# Patient Record
Sex: Female | Born: 1980 | Race: Black or African American | Hispanic: No | Marital: Married | Smoking: Never smoker
Health system: Southern US, Community
[De-identification: ages and names within clinical notes are randomized; demographics above are authoritative.]

## PROBLEM LIST (undated history)

## (undated) ENCOUNTER — Inpatient Hospital Stay (HOSPITAL_COMMUNITY): Payer: Self-pay

## (undated) DIAGNOSIS — K429 Umbilical hernia without obstruction or gangrene: Secondary | ICD-10-CM

## (undated) DIAGNOSIS — F419 Anxiety disorder, unspecified: Secondary | ICD-10-CM

## (undated) DIAGNOSIS — I10 Essential (primary) hypertension: Secondary | ICD-10-CM

## (undated) DIAGNOSIS — F32A Depression, unspecified: Secondary | ICD-10-CM

## (undated) DIAGNOSIS — O24419 Gestational diabetes mellitus in pregnancy, unspecified control: Secondary | ICD-10-CM

## (undated) DIAGNOSIS — J45909 Unspecified asthma, uncomplicated: Secondary | ICD-10-CM

## (undated) DIAGNOSIS — F329 Major depressive disorder, single episode, unspecified: Secondary | ICD-10-CM

## (undated) DIAGNOSIS — O169 Unspecified maternal hypertension, unspecified trimester: Secondary | ICD-10-CM

## (undated) HISTORY — DX: Unspecified maternal hypertension, unspecified trimester: O16.9

## (undated) HISTORY — PX: DILATION AND CURETTAGE OF UTERUS: SHX78

## (undated) HISTORY — DX: Gestational diabetes mellitus in pregnancy, unspecified control: O24.419

---

## 2011-11-13 ENCOUNTER — Ambulatory Visit
Admission: RE | Admit: 2011-11-13 | Discharge: 2011-11-13 | Disposition: A | Discharge status: Home / Self Care | Payer: No Typology Code available for payment source | Source: Ambulatory Visit | Attending: Occupational Medicine | Admitting: Occupational Medicine

## 2011-11-13 ENCOUNTER — Other Ambulatory Visit: Payer: Self-pay | Admitting: Occupational Medicine

## 2011-11-13 DIAGNOSIS — R52 Pain, unspecified: Secondary | ICD-10-CM

## 2013-11-09 ENCOUNTER — Encounter (HOSPITAL_COMMUNITY): Payer: Self-pay | Admitting: Emergency Medicine

## 2013-11-09 ENCOUNTER — Emergency Department (HOSPITAL_COMMUNITY)
Admission: EM | Admit: 2013-11-09 | Discharge: 2013-11-09 | Discharge status: Against Medical Advice | Payer: Medicaid Other | Attending: Emergency Medicine | Admitting: Emergency Medicine

## 2013-11-09 ENCOUNTER — Emergency Department (HOSPITAL_COMMUNITY): Payer: Medicaid Other

## 2013-11-09 DIAGNOSIS — X500XXA Overexertion from strenuous movement or load, initial encounter: Secondary | ICD-10-CM | POA: Insufficient documentation

## 2013-11-09 DIAGNOSIS — S99929A Unspecified injury of unspecified foot, initial encounter: Secondary | ICD-10-CM | POA: Diagnosis not present

## 2013-11-09 DIAGNOSIS — S99919A Unspecified injury of unspecified ankle, initial encounter: Secondary | ICD-10-CM | POA: Diagnosis not present

## 2013-11-09 DIAGNOSIS — S8990XA Unspecified injury of unspecified lower leg, initial encounter: Secondary | ICD-10-CM | POA: Insufficient documentation

## 2013-11-09 DIAGNOSIS — Y9289 Other specified places as the place of occurrence of the external cause: Secondary | ICD-10-CM | POA: Diagnosis not present

## 2013-11-09 DIAGNOSIS — Y9301 Activity, walking, marching and hiking: Secondary | ICD-10-CM | POA: Insufficient documentation

## 2013-11-09 DIAGNOSIS — J45909 Unspecified asthma, uncomplicated: Secondary | ICD-10-CM | POA: Diagnosis not present

## 2013-11-09 HISTORY — DX: Unspecified asthma, uncomplicated: J45.909

## 2013-11-09 NOTE — ED Notes (Signed)
Pt transported back to room from radiology.  

## 2013-11-09 NOTE — ED Notes (Signed)
Pt c/o left knee pain after hearing pop and having pain today while walking

## 2014-07-11 ENCOUNTER — Emergency Department (HOSPITAL_COMMUNITY): Payer: Medicaid Other

## 2014-07-11 ENCOUNTER — Encounter (HOSPITAL_COMMUNITY): Payer: Self-pay

## 2014-07-11 ENCOUNTER — Emergency Department (HOSPITAL_COMMUNITY)
Admission: EM | Admit: 2014-07-11 | Discharge: 2014-07-11 | Disposition: A | Discharge status: Home / Self Care | Payer: Medicaid Other | Attending: Emergency Medicine | Admitting: Emergency Medicine

## 2014-07-11 DIAGNOSIS — Y998 Other external cause status: Secondary | ICD-10-CM | POA: Insufficient documentation

## 2014-07-11 DIAGNOSIS — Z79899 Other long term (current) drug therapy: Secondary | ICD-10-CM | POA: Diagnosis not present

## 2014-07-11 DIAGNOSIS — S63601A Unspecified sprain of right thumb, initial encounter: Secondary | ICD-10-CM | POA: Diagnosis not present

## 2014-07-11 DIAGNOSIS — Y9289 Other specified places as the place of occurrence of the external cause: Secondary | ICD-10-CM | POA: Insufficient documentation

## 2014-07-11 DIAGNOSIS — J45909 Unspecified asthma, uncomplicated: Secondary | ICD-10-CM | POA: Diagnosis not present

## 2014-07-11 DIAGNOSIS — Y9389 Activity, other specified: Secondary | ICD-10-CM | POA: Diagnosis not present

## 2014-07-11 DIAGNOSIS — X58XXXA Exposure to other specified factors, initial encounter: Secondary | ICD-10-CM | POA: Insufficient documentation

## 2014-07-11 DIAGNOSIS — S60931A Unspecified superficial injury of right thumb, initial encounter: Secondary | ICD-10-CM | POA: Diagnosis present

## 2014-07-11 MED ORDER — IBUPROFEN 800 MG PO TABS: 800.0000 mg | ORAL_TABLET | Freq: Three times a day (TID) | ORAL | Status: DC

## 2014-07-11 MED ORDER — IBUPROFEN 400 MG PO TABS: 800.0000 mg | ORAL_TABLET | Freq: Once | ORAL | Status: DC

## 2014-07-11 NOTE — ED Notes (Signed)
Pt reports 30 minutes ago was trying to open new jar of honey and felt sharp pain to right thumb, right hand swollen.  Pt unable to make fist.

## 2014-07-11 NOTE — Discharge Instructions (Signed)
Take ibuprofen as directed for pain. Rest, ice and elevate your hand.  Finger Sprain A finger sprain is a tear in one of the strong, fibrous tissues that connect the bones (ligaments) in your finger. The severity of the sprain depends on how much of the ligament is torn. The tear can be either partial or complete. CAUSES  Often, sprains are a result of a fall or accident. If you extend your hands to catch an object or to protect yourself, the force of the impact causes the fibers of your ligament to stretch too much. This excess tension causes the fibers of your ligament to tear. SYMPTOMS  You may have some loss of motion in your finger. Other symptoms include:  Bruising.  Tenderness.  Swelling. DIAGNOSIS  In order to diagnose finger sprain, your caregiver will physically examine your finger or thumb to determine how torn the ligament is. Your caregiver may also suggest an X-ray exam of your finger to make sure no bones are broken. TREATMENT  If your ligament is only partially torn, treatment usually involves keeping the finger in a fixed position (immobilization) for a short period. To do this, your caregiver will apply a bandage, cast, or splint to keep your finger from moving until it heals. For a partially torn ligament, the healing process usually takes 2 to 3 weeks. If your ligament is completely torn, you may need surgery to reconnect the ligament to the bone. After surgery a cast or splint will be applied and will need to stay on your finger or thumb for 4 to 6 weeks while your ligament heals. HOME CARE INSTRUCTIONS  Keep your injured finger elevated, when possible, to decrease swelling.  To ease pain and swelling, apply ice to your joint twice a day, for 2 to 3 days:  Put ice in a plastic bag.  Place a towel between your skin and the bag.  Leave the ice on for 15 minutes.  Only take over-the-counter or prescription medicine for pain as directed by your caregiver.  Do not  wear rings on your injured finger.  Do not leave your finger unprotected until pain and stiffness go away (usually 3 to 4 weeks).  Do not allow your cast or splint to get wet. Cover your cast or splint with a plastic bag when you shower or bathe. Do not swim.  Your caregiver may suggest special exercises for you to do during your recovery to prevent or limit permanent stiffness. SEEK IMMEDIATE MEDICAL CARE IF:  Your cast or splint becomes damaged.  Your pain becomes worse rather than better. MAKE SURE YOU:  Understand these instructions.  Will watch your condition.  Will get help right away if you are not doing well or get worse. Document Released: 05/17/2004 Document Revised: 07/02/2011 Document Reviewed: 12/11/2010 [facility] Patient Information 2015 [place], [place]. This information is not intended to replace advice given to you by your health care provider. Make sure you discuss any questions you have with your health care provider.

## 2014-07-11 NOTE — ED Provider Notes (Signed)
CSN: [id number]     Arrival date & time 07/11/14  2147 History  This chart was scribed for non-physician practitioner, Celene Skeen, PA-C,working with Eber Hong, MD, by Karle Plumber, ED Scribe. This patient was seen in room TR06C/TR06C and the patient's care was started at 10:47 PM.  Chief Complaint  Patient presents with  . Hand Injury   The history is provided by the patient and medical records. No language interpreter was used.    HPI Comments:  Connie Webster is a 34 y.o. female who presents to the Emergency Department complaining of severe right thumb pain that began approximately one hour ago. She states she was trying to open a jar of honey when she heard a "popping" noise. She states her right thumb and right hand began swelling immediately. She has not done anything to treat her symptoms. Moving the thumb makes the pain worse. Denies alleviating factors. Denies numbness, tingling or weakness of the right hand or right thumb, bruising or wounds. Pt is right hand dominant. PMHx of asthma.  Past Medical History  Diagnosis Date  . Asthma    History reviewed. No pertinent past surgical history. History reviewed. No pertinent family history. History  Substance Use Topics  . Smoking status: Never Smoker   . Smokeless tobacco: Not on file  . Alcohol Use: Yes     Comment: occ   OB History    No data available     Review of Systems  Musculoskeletal: Positive for arthralgias.  All other systems reviewed and are negative.   Allergies  Review of patient's allergies indicates no known allergies.  Home Medications   Prior to Admission medications   Medication Sig Start Date End Date Taking? Authorizing Provider  albuterol (PROVENTIL HFA;VENTOLIN HFA) 108 (90 BASE) MCG/ACT inhaler Inhale 2 puffs into the lungs every 4 (four) hours as needed for wheezing or shortness of breath.    Historical Provider, MD  ibuprofen (ADVIL,MOTRIN) 800 MG tablet Take 1 tablet (800 mg total) by  mouth 3 (three) times daily. 07/11/14   Kathrynn Speed, PA-C   Triage Vitals: BP 140/88 mmHg  Pulse 65  Temp(Src) 98.4 F (36.9 C) (Oral)  Resp 22  SpO2 99%  LMP 06/07/2014 Physical Exam  Constitutional: She is oriented to person, place, and time. She appears well-developed and well-nourished. No distress.  HENT:  Head: Normocephalic and atraumatic.  Mouth/Throat: Oropharynx is clear and moist.  Eyes: Conjunctivae and EOM are normal.  Neck: Normal range of motion. Neck supple.  Cardiovascular: Normal rate, regular rhythm and normal heart sounds.   +2 radial pulse on right.  Pulmonary/Chest: Effort normal and breath sounds normal. No respiratory distress.  Musculoskeletal:  R hand- TTP in webspace between 1st and 2nd metacarpal with mild swelling. TTP over 1st metacarpal, MCP and proximal phalanx. ROM limited by pain. Normal strength with flexion and extension of MCP and DIP of thumb. No evidence of tendon disruption.  Neurological: She is alert and oriented to person, place, and time. No sensory deficit.  Skin: Skin is warm and dry.  Cap refill < 3 seconds.  Psychiatric: She has a normal mood and affect. Her behavior is normal.  Nursing note and vitals reviewed.   ED Course  Procedures (including critical care time) DIAGNOSTIC STUDIES: Oxygen Saturation is 99% on RA, normal by my interpretation.   COORDINATION OF CARE: 10:52 PM- Will prescribe NSAID and ace wrap hand. Pt verbalizes understanding and agrees to plan.  Medications  ibuprofen (ADVIL,MOTRIN) tablet  800 mg (not administered)    Labs Review Labs Reviewed - No data to display  Imaging Review Dg Hand Complete Right  07/11/2014   CLINICAL DATA:  Injured right hand well opening at our of honey. Thumb pain.  EXAM: RIGHT HAND - COMPLETE 3+ VIEW  COMPARISON:  None.  FINDINGS: There is no evidence of fracture or dislocation. There is no evidence of arthropathy or other focal bone abnormality. Soft tissues are unremarkable.   IMPRESSION: Negative.   Electronically Signed   By: Ellery Plunkaniel R Mitchell M.D.   On: 07/11/2014 22:30     EKG Interpretation None      MDM   Final diagnoses:  Thumb sprain, right, initial encounter   Neurovascularly intact. Xray negative. RICE, NSAIDs. ACE wrap applied. Stable for d/c. Return precautions given. Patient states understanding of treatment care plan and is agreeable.  I personally performed the services described in this documentation, which was scribed in my presence. The recorded information has been reviewed and is accurate.    Kathrynn SpeedRobyn M Lessie Funderburke, PA-C 07/11/14 2259  Eber HongBrian Miller, MD 07/12/14 [phone]

## 2016-04-23 NOTE — L&D Delivery Note (Signed)
Patient is 36 y.o. [id number] 614w3d admitted for IOL 2/2 gHTN. S/p IOL with cytotec, followed by Pitocin. SROM at 0115.  Prenatal course also complicated by gHTN, A1GDM, AMA.  Delivery Note At 3:22 AM a viable female was delivered via Vaginal, Spontaneous (Presentation: LOA).  APGAR: 7, 8; weight  pending.   Placenta status: Intact, .  Cord: 3V  with the following complications: None.  Cord pH: N/A  Anesthesia: Epidural  Episiotomy: None Lacerations: None Est. Blood Loss (mL): 100  Head delivered LOA. No nuchal cord present. Shoulder and body delivered in usual fashion. Infant with spontaneous cry, placed on mother's abdomen, dried and bulb suctioned. Cord clamped x 2 after 1-minute delay, and cut by family member. Cord blood drawn. Placenta delivered spontaneously with gentle cord traction. Fundus firm with massage and Pitocin. Perineum inspected and found to have no laceration.  Mom to postpartum.  Baby to Couplet care / Skin to Skin.  Caryl AdaJazma Rida Loudin, DO OB Fellow

## 2016-09-11 ENCOUNTER — Encounter: Payer: Self-pay | Admitting: Obstetrics and Gynecology

## 2016-09-11 ENCOUNTER — Ambulatory Visit (INDEPENDENT_AMBULATORY_CARE_PROVIDER_SITE_OTHER): Payer: BLUE CROSS/BLUE SHIELD | Admitting: Obstetrics and Gynecology

## 2016-09-11 VITALS — BP 130/83 | HR 80 | Ht 63.25 in | Wt 143.0 lb

## 2016-09-11 DIAGNOSIS — Z3480 Encounter for supervision of other normal pregnancy, unspecified trimester: Secondary | ICD-10-CM

## 2016-09-11 DIAGNOSIS — J45909 Unspecified asthma, uncomplicated: Secondary | ICD-10-CM

## 2016-09-11 DIAGNOSIS — Z113 Encounter for screening for infections with a predominantly sexual mode of transmission: Secondary | ICD-10-CM | POA: Diagnosis not present

## 2016-09-11 DIAGNOSIS — O09521 Supervision of elderly multigravida, first trimester: Secondary | ICD-10-CM

## 2016-09-11 DIAGNOSIS — Z124 Encounter for screening for malignant neoplasm of cervix: Secondary | ICD-10-CM | POA: Diagnosis not present

## 2016-09-11 DIAGNOSIS — O99511 Diseases of the respiratory system complicating pregnancy, first trimester: Secondary | ICD-10-CM

## 2016-09-11 DIAGNOSIS — O0993 Supervision of high risk pregnancy, unspecified, third trimester: Secondary | ICD-10-CM | POA: Insufficient documentation

## 2016-09-11 DIAGNOSIS — Z3481 Encounter for supervision of other normal pregnancy, first trimester: Secondary | ICD-10-CM

## 2016-09-11 DIAGNOSIS — O09529 Supervision of elderly multigravida, unspecified trimester: Secondary | ICD-10-CM | POA: Insufficient documentation

## 2016-09-11 DIAGNOSIS — O99519 Diseases of the respiratory system complicating pregnancy, unspecified trimester: Secondary | ICD-10-CM | POA: Insufficient documentation

## 2016-09-11 MED ORDER — VITAFOL ULTRA 29-0.6-0.4-200 MG PO CAPS: 1.0000 | ORAL_CAPSULE | Freq: Every day | ORAL | 12 refills | Status: DC

## 2016-09-11 NOTE — Addendum Note (Signed)
Addended by: Catalina AntiguaONSTANT, Cohl Behrens on: 09/11/2016 02:21 PM   Modules accepted: Orders

## 2016-09-11 NOTE — Patient Instructions (Signed)
 First Trimester of Pregnancy The first trimester of pregnancy is from week 1 until the end of week 13 (months 1 through 3). A week after a sperm fertilizes an egg, the egg will implant on the wall of the uterus. This embryo will begin to develop into a baby. Genes from you and your partner will form the baby. The female genes will determine whether the baby will be a boy or a girl. At 6-8 weeks, the eyes and face will be formed, and the heartbeat can be seen on ultrasound. At the end of 12 weeks, all the baby's organs will be formed. Now that you are pregnant, you will want to do everything you can to have a healthy baby. Two of the most important things are to get good prenatal care and to follow your health care provider's instructions. Prenatal care is all the medical care you receive before the baby's birth. This care will help prevent, find, and treat any problems during the pregnancy and childbirth. Body changes during your first trimester Your body goes through many changes during pregnancy. The changes vary from woman to woman.  You may gain or lose a couple of pounds at first.  You may feel sick to your stomach (nauseous) and you may throw up (vomit). If the vomiting is uncontrollable, call your health care provider.  You may tire easily.  You may develop headaches that can be relieved by medicines. All medicines should be approved by your health care provider.  You may urinate more often. Painful urination may mean you have a bladder infection.  You may develop heartburn as a result of your pregnancy.  You may develop constipation because certain hormones are causing the muscles that push stool through your intestines to slow down.  You may develop hemorrhoids or swollen veins (varicose veins).  Your breasts may begin to grow larger and become tender. Your nipples may stick out more, and the tissue that surrounds them (areola) may become darker.  Your gums may bleed and may be  sensitive to brushing and flossing.  Dark spots or blotches (chloasma, mask of pregnancy) may develop on your face. This will likely fade after the baby is born.  Your menstrual periods will stop.  You may have a loss of appetite.  You may develop cravings for certain kinds of food.  You may have changes in your emotions from day to day, such as being excited to be pregnant or being concerned that something may go wrong with the pregnancy and baby.  You may have more vivid and strange dreams.  You may have changes in your hair. These can include thickening of your hair, rapid growth, and changes in texture. Some women also have hair loss during or after pregnancy, or hair that feels dry or thin. Your hair will most likely return to normal after your baby is born.  What to expect at prenatal visits During a routine prenatal visit:  You will be weighed to make sure you and the baby are growing normally.  Your blood pressure will be taken.  Your abdomen will be measured to track your baby's growth.  The fetal heartbeat will be listened to between weeks 10 and 14 of your pregnancy.  Test results from any previous visits will be discussed.  Your health care provider may ask you:  How you are feeling.  If you are feeling the baby move.  If you have had any abnormal symptoms, such as leaking fluid, bleeding, severe   headaches, or abdominal cramping.  If you are using any tobacco products, including cigarettes, chewing tobacco, and electronic cigarettes.  If you have any questions.  Other tests that may be performed during your first trimester include:  Blood tests to find your blood type and to check for the presence of any previous infections. The tests will also be used to check for low iron levels (anemia) and protein on red blood cells (Rh antibodies). Depending on your risk factors, or if you previously had diabetes during pregnancy, you may have tests to check for high blood  sugar that affects pregnant women (gestational diabetes).  Urine tests to check for infections, diabetes, or protein in the urine.  An ultrasound to confirm the proper growth and development of the baby.  Fetal screens for spinal cord problems (spina bifida) and Down syndrome.  HIV (human immunodeficiency virus) testing. Routine prenatal testing includes screening for HIV, unless you choose not to have this test.  You may need other tests to make sure you and the baby are doing well.  Follow these instructions at home: Medicines  Follow your health care provider's instructions regarding medicine use. Specific medicines may be either safe or unsafe to take during pregnancy.  Take a prenatal vitamin that contains at least 600 micrograms (mcg) of folic acid.  If you develop constipation, try taking a stool softener if your health care provider approves. Eating and drinking  Eat a balanced diet that includes fresh fruits and vegetables, whole grains, good sources of protein such as meat, eggs, or tofu, and low-fat dairy. Your health care provider will help you determine the amount of weight gain that is right for you.  Avoid raw meat and uncooked cheese. These carry germs that can cause birth defects in the baby.  Eating four or five small meals rather than three large meals a day may help relieve nausea and vomiting. If you start to feel nauseous, eating a few soda crackers can be helpful. Drinking liquids between meals, instead of during meals, also seems to help ease nausea and vomiting.  Limit foods that are high in fat and processed sugars, such as fried and sweet foods.  To prevent constipation: ? Eat foods that are high in fiber, such as fresh fruits and vegetables, whole grains, and beans. ? Drink enough fluid to keep your urine clear or pale yellow. Activity  Exercise only as directed by your health care provider. Most women can continue their usual exercise routine during  pregnancy. Try to exercise for 30 minutes at least 5 days a week. Exercising will help you: ? Control your weight. ? Stay in shape. ? Be prepared for labor and delivery.  Experiencing pain or cramping in the lower abdomen or lower back is a good sign that you should stop exercising. Check with your health care provider before continuing with normal exercises.  Try to avoid standing for long periods of time. Move your legs often if you must stand in one place for a long time.  Avoid heavy lifting.  Wear low-heeled shoes and practice good posture.  You may continue to have sex unless your health care provider tells you not to. Relieving pain and discomfort  Wear a good support bra to relieve breast tenderness.  Take warm sitz baths to soothe any pain or discomfort caused by hemorrhoids. Use hemorrhoid cream if your health care provider approves.  Rest with your legs elevated if you have leg cramps or low back pain.  If you   develop varicose veins in your legs, wear support hose. Elevate your feet for 15 minutes, 3-4 times a day. Limit salt in your diet. Prenatal care  Schedule your prenatal visits by the twelfth week of pregnancy. They are usually scheduled monthly at first, then more often in the last 2 months before delivery.  Write down your questions. Take them to your prenatal visits.  Keep all your prenatal visits as told by your health care provider. This is important. Safety  Wear your seat belt at all times when driving.  Make a list of emergency phone numbers, including numbers for family, friends, the hospital, and police and fire departments. General instructions  Ask your health care provider for a referral to a local prenatal education class. Begin classes no later than the beginning of month 6 of your pregnancy.  Ask for help if you have counseling or nutritional needs during pregnancy. Your health care provider can offer advice or refer you to specialists for help  with various needs.  Do not use hot tubs, steam rooms, or saunas.  Do not douche or use tampons or scented sanitary pads.  Do not cross your legs for long periods of time.  Avoid cat litter boxes and soil used by cats. These carry germs that can cause birth defects in the baby and possibly loss of the fetus by miscarriage or stillbirth.  Avoid all smoking, herbs, alcohol, and medicines not prescribed by your health care provider. Chemicals in these products affect the formation and growth of the baby.  Do not use any products that contain nicotine or tobacco, such as cigarettes and e-cigarettes. If you need help quitting, ask your health care provider. You may receive counseling support and other resources to help you quit.  Schedule a dentist appointment. At home, brush your teeth with a soft toothbrush and be gentle when you floss. Contact a health care provider if:  You have dizziness.  You have mild pelvic cramps, pelvic pressure, or nagging pain in the abdominal area.  You have persistent nausea, vomiting, or diarrhea.  You have a bad smelling vaginal discharge.  You have pain when you urinate.  You notice increased swelling in your face, hands, legs, or ankles.  You are exposed to fifth disease or chickenpox.  You are exposed to German measles (rubella) and have never had it. Get help right away if:  You have a fever.  You are leaking fluid from your vagina.  You have spotting or bleeding from your vagina.  You have severe abdominal cramping or pain.  You have rapid weight gain or loss.  You vomit blood or material that looks like coffee grounds.  You develop a severe headache.  You have shortness of breath.  You have any kind of trauma, such as from a fall or a car accident. Summary  The first trimester of pregnancy is from week 1 until the end of week 13 (months 1 through 3).  Your body goes through many changes during pregnancy. The changes vary from  woman to woman.  You will have routine prenatal visits. During those visits, your health care provider will examine you, discuss any test results you may have, and talk with you about how you are feeling. This information is not intended to replace advice given to you by your health care provider. Make sure you discuss any questions you have with your health care provider. Document Released: 04/03/2001 Document Revised: 03/21/2016 Document Reviewed: 03/21/2016 Elsevier Interactive Patient Education  2017   [employer]  Contraception Choices Contraception (birth control) is the use of any methods or devices to prevent pregnancy. Below are some methods to help avoid pregnancy. Hormonal methods  Contraceptive implant. This is a thin, plastic tube containing progesterone hormone. It does not contain estrogen hormone. Your health care provider inserts the tube in the inner part of the upper arm. The tube can remain in place for up to 3 years. After 3 years, the implant must be removed. The implant prevents the ovaries from releasing an egg (ovulation), thickens the cervical mucus to prevent sperm from entering the uterus, and thins the lining of the inside of the uterus.  Progesterone-only injections. These injections are given every 3 months by your health care provider to prevent pregnancy. This synthetic progesterone hormone stops the ovaries from releasing eggs. It also thickens cervical mucus and changes the uterine lining. This makes it harder for sperm to survive in the uterus.  Birth control pills. These pills contain estrogen and progesterone hormone. They work by preventing the ovaries from releasing eggs (ovulation). They also cause the cervical mucus to thicken, preventing the sperm from entering the uterus. Birth control pills are prescribed by a health care provider.Birth control pills can also be used to treat heavy periods.  Minipill. This type of birth control pill contains only the  progesterone hormone. They are taken every day of each month and must be prescribed by your health care provider.  Birth control patch. The patch contains hormones similar to those in birth control pills. It must be changed once a week and is prescribed by a health care provider.  Vaginal ring. The ring contains hormones similar to those in birth control pills. It is left in the vagina for 3 weeks, removed for 1 week, and then a new one is put back in place. The patient must be comfortable inserting and removing the ring from the vagina.A health care provider's prescription is necessary.  Emergency contraception. Emergency contraceptives prevent pregnancy after unprotected sexual intercourse. This pill can be taken right after sex or up to 5 days after unprotected sex. It is most effective the sooner you take the pills after having sexual intercourse. Most emergency contraceptive pills are available without a prescription. Check with your pharmacist. Do not use emergency contraception as your only form of birth control. Barrier methods  Female condom. This is a thin sheath (latex or rubber) that is worn over the penis during sexual intercourse. It can be used with spermicide to increase effectiveness.  Female condom. This is a soft, loose-fitting sheath that is put into the vagina before sexual intercourse.  Diaphragm. This is a soft, latex, dome-shaped barrier that must be fitted by a health care provider. It is inserted into the vagina, along with a spermicidal jelly. It is inserted before intercourse. The diaphragm should be left in the vagina for 6 to 8 hours after intercourse.  Cervical cap. This is a round, soft, latex or plastic cup that fits over the cervix and must be fitted by a health care provider. The cap can be left in place for up to 48 hours after intercourse.  Sponge. This is a soft, circular piece of polyurethane foam. The sponge has spermicide in it. It is inserted into the vagina  after wetting it and before sexual intercourse.  Spermicides. These are chemicals that kill or block sperm from entering the cervix and uterus. They come in the form of creams, jellies, suppositories, foam, or tablets. They do not   require a prescription. They are inserted into the vagina with an applicator before having sexual intercourse. The process must be repeated every time you have sexual intercourse. Intrauterine contraception  Intrauterine device (IUD). This is a T-shaped device that is put in a woman's uterus during a menstrual period to prevent pregnancy. There are 2 types: ? Copper IUD. This type of IUD is wrapped in copper wire and is placed inside the uterus. Copper makes the uterus and fallopian tubes produce a fluid that kills sperm. It can stay in place for 10 years. ? Hormone IUD. This type of IUD contains the hormone progestin (synthetic progesterone). The hormone thickens the cervical mucus and prevents sperm from entering the uterus, and it also thins the uterine lining to prevent implantation of a fertilized egg. The hormone can weaken or kill the sperm that get into the uterus. It can stay in place for 3-5 years, depending on which type of IUD is used. Permanent methods of contraception  Female tubal ligation. This is when the woman's fallopian tubes are surgically sealed, tied, or blocked to prevent the egg from traveling to the uterus.  Hysteroscopic sterilization. This involves placing a small coil or insert into each fallopian tube. Your doctor uses a technique called hysteroscopy to do the procedure. The device causes scar tissue to form. This results in permanent blockage of the fallopian tubes, so the sperm cannot fertilize the egg. It takes about 3 months after the procedure for the tubes to become blocked. You must use another form of birth control for these 3 months.  Female sterilization. This is when the female has the tubes that carry sperm tied off (vasectomy).This  blocks sperm from entering the vagina during sexual intercourse. After the procedure, the man can still ejaculate fluid (semen). Natural planning methods  Natural family planning. This is not having sexual intercourse or using a barrier method (condom, diaphragm, cervical cap) on days the woman could become pregnant.  Calendar method. This is keeping track of the length of each menstrual cycle and identifying when you are fertile.  Ovulation method. This is avoiding sexual intercourse during ovulation.  Symptothermal method. This is avoiding sexual intercourse during ovulation, using a thermometer and ovulation symptoms.  Post-ovulation method. This is timing sexual intercourse after you have ovulated. Regardless of which type or method of contraception you choose, it is important that you use condoms to protect against the transmission of sexually transmitted infections (STIs). Talk with your health care provider about which form of contraception is most appropriate for you. This information is not intended to replace advice given to you by your health care provider. Make sure you discuss any questions you have with your health care provider. Document Released: 04/09/2005 Document Revised: 09/15/2015 Document Reviewed: 10/02/2012 Elsevier Interactive Patient Education  2017 [employer]   Breastfeeding Deciding to breastfeed is one of the best choices you can make for you and your baby. A change in hormones during pregnancy causes your breast tissue to grow and increases the number and size of your milk ducts. These hormones also allow proteins, sugars, and fats from your blood supply to make breast milk in your milk-producing glands. Hormones prevent breast milk from being released before your baby is born as well as prompt milk flow after birth. Once breastfeeding has begun, thoughts of your baby, as well as his or her sucking or crying, can stimulate the release of milk from your  milk-producing glands. Benefits of breastfeeding For Your Baby  Your   first milk (colostrum) helps your baby's digestive system function better.  There are antibodies in your milk that help your baby fight off infections.  Your baby has a lower incidence of asthma, allergies, and sudden infant death syndrome.  The nutrients in breast milk are better for your baby than infant formulas and are designed uniquely for your baby's needs.  Breast milk improves your baby's brain development.  Your baby is less likely to develop other conditions, such as childhood obesity, asthma, or type 2 diabetes mellitus.  For You  Breastfeeding helps to create a very special bond between you and your baby.  Breastfeeding is convenient. Breast milk is always available at the correct temperature and costs nothing.  Breastfeeding helps to burn calories and helps you lose the weight gained during pregnancy.  Breastfeeding makes your uterus contract to its prepregnancy size faster and slows bleeding (lochia) after you give birth.  Breastfeeding helps to lower your risk of developing type 2 diabetes mellitus, osteoporosis, and breast or ovarian cancer later in life.  Signs that your baby is hungry Early Signs of Hunger  Increased alertness or activity.  Stretching.  Movement of the head from side to side.  Movement of the head and opening of the mouth when the corner of the mouth or cheek is stroked (rooting).  Increased sucking sounds, smacking lips, cooing, sighing, or squeaking.  Hand-to-mouth movements.  Increased sucking of fingers or hands.  Late Signs of Hunger  Fussing.  Intermittent crying.  Extreme Signs of Hunger Signs of extreme hunger will require calming and consoling before your baby will be able to breastfeed successfully. Do not wait for the following signs of extreme hunger to occur before you initiate breastfeeding:  Restlessness.  A loud, strong  cry.  Screaming.  Breastfeeding basics Breastfeeding Initiation  Find a comfortable place to sit or lie down, with your neck and back well supported.  Place a pillow or rolled up blanket under your baby to bring him or her to the level of your breast (if you are seated). Nursing pillows are specially designed to help support your arms and your baby while you breastfeed.  Make sure that your baby's abdomen is facing your abdomen.  Gently massage your breast. With your fingertips, massage from your chest wall toward your nipple in a circular motion. This encourages milk flow. You may need to continue this action during the feeding if your milk flows slowly.  Support your breast with 4 fingers underneath and your thumb above your nipple. Make sure your fingers are well away from your nipple and your baby's mouth.  Stroke your baby's lips gently with your finger or nipple.  When your baby's mouth is open wide enough, quickly bring your baby to your breast, placing your entire nipple and as much of the colored area around your nipple (areola) as possible into your baby's mouth. ? More areola should be visible above your baby's upper lip than below the lower lip. ? Your baby's tongue should be between his or her lower gum and your breast.  Ensure that your baby's mouth is correctly positioned around your nipple (latched). Your baby's lips should create a seal on your breast and be turned out (everted).  It is common for your baby to suck about 2-3 minutes in order to start the flow of breast milk.  Latching Teaching your baby how to latch on to your breast properly is very important. An improper latch can cause nipple pain and decreased   milk supply for you and poor weight gain in your baby. Also, if your baby is not latched onto your nipple properly, he or she may swallow some air during feeding. This can make your baby fussy. Burping your baby when you switch breasts during the feeding can  help to get rid of the air. However, teaching your baby to latch on properly is still the best way to prevent fussiness from swallowing air while breastfeeding. Signs that your baby has successfully latched on to your nipple:  Silent tugging or silent sucking, without causing you pain.  Swallowing heard between every 3-4 sucks.  Muscle movement above and in front of his or her ears while sucking.  Signs that your baby has not successfully latched on to nipple:  Sucking sounds or smacking sounds from your baby while breastfeeding.  Nipple pain.  If you think your baby has not latched on correctly, slip your finger into the corner of your baby's mouth to break the suction and place it between your baby's gums. Attempt breastfeeding initiation again. Signs of Successful Breastfeeding Signs from your baby:  A gradual decrease in the number of sucks or complete cessation of sucking.  Falling asleep.  Relaxation of his or her body.  Retention of a small amount of milk in his or her mouth.  Letting go of your breast by himself or herself.  Signs from you:  Breasts that have increased in firmness, weight, and size 1-3 hours after feeding.  Breasts that are softer immediately after breastfeeding.  Increased milk volume, as well as a change in milk consistency and color by the fifth day of breastfeeding.  Nipples that are not sore, cracked, or bleeding.  Signs That Your Baby is Getting Enough Milk  Wetting at least 1-2 diapers during the first 24 hours after birth.  Wetting at least 5-6 diapers every 24 hours for the first week after birth. The urine should be clear or pale yellow by 5 days after birth.  Wetting 6-8 diapers every 24 hours as your baby continues to grow and develop.  At least 3 stools in a 24-hour period by age 5 days. The stool should be soft and yellow.  At least 3 stools in a 24-hour period by age 7 days. The stool should be seedy and yellow.  No loss of  weight greater than 10% of birth weight during the first 3 days of age.  Average weight gain of 4-7 ounces (113-198 g) per week after age 4 days.  Consistent daily weight gain by age 5 days, without weight loss after the age of 2 weeks.  After a feeding, your baby may spit up a small amount. This is common. Breastfeeding frequency and duration Frequent feeding will help you make more milk and can prevent sore nipples and breast engorgement. Breastfeed when you feel the need to reduce the fullness of your breasts or when your baby shows signs of hunger. This is called "breastfeeding on demand." Avoid introducing a pacifier to your baby while you are working to establish breastfeeding (the first 4-6 weeks after your baby is born). After this time you may choose to use a pacifier. Research has shown that pacifier use during the first year of a baby's life decreases the risk of sudden infant death syndrome (SIDS). Allow your baby to feed on each breast as long as he or she wants. Breastfeed until your baby is finished feeding. When your baby unlatches or falls asleep while feeding from the first   breast, offer the second breast. Because newborns are often sleepy in the first few weeks of life, you may need to awaken your baby to get him or her to feed. Breastfeeding times will vary from baby to baby. However, the following rules can serve as a guide to help you ensure that your baby is properly fed:  Newborns (babies 4 weeks of age or younger) may breastfeed every 1-3 hours.  Newborns should not go longer than 3 hours during the day or 5 hours during the night without breastfeeding.  You should breastfeed your baby a minimum of 8 times in a 24-hour period until you begin to introduce solid foods to your baby at around 6 months of age.  Breast milk pumping Pumping and storing breast milk allows you to ensure that your baby is exclusively fed your breast milk, even at times when you are unable to  breastfeed. This is especially important if you are going back to work while you are still breastfeeding or when you are not able to be present during feedings. Your lactation consultant can give you guidelines on how long it is safe to store breast milk. A breast pump is a machine that allows you to pump milk from your breast into a sterile bottle. The pumped breast milk can then be stored in a refrigerator or freezer. Some breast pumps are operated by hand, while others use electricity. Ask your lactation consultant which type will work best for you. Breast pumps can be purchased, but some hospitals and breastfeeding support groups lease breast pumps on a monthly basis. A lactation consultant can teach you how to hand express breast milk, if you prefer not to use a pump. Caring for your breasts while you breastfeed Nipples can become dry, cracked, and sore while breastfeeding. The following recommendations can help keep your breasts moisturized and healthy:  Avoid using soap on your nipples.  Wear a supportive bra. Although not required, special nursing bras and tank tops are designed to allow access to your breasts for breastfeeding without taking off your entire bra or top. Avoid wearing underwire-style bras or extremely tight bras.  Air dry your nipples for 3-4minutes after each feeding.  Use only cotton bra pads to absorb leaked breast milk. Leaking of breast milk between feedings is normal.  Use lanolin on your nipples after breastfeeding. Lanolin helps to maintain your skin's normal moisture barrier. If you use pure lanolin, you do not need to wash it off before feeding your baby again. Pure lanolin is not toxic to your baby. You may also hand express a few drops of breast milk and gently massage that milk into your nipples and allow the milk to air dry.  In the first few weeks after giving birth, some women experience extremely full breasts (engorgement). Engorgement can make your breasts  feel heavy, warm, and tender to the touch. Engorgement peaks within 3-5 days after you give birth. The following recommendations can help ease engorgement:  Completely empty your breasts while breastfeeding or pumping. You may want to start by applying warm, moist heat (in the shower or with warm water-soaked hand towels) just before feeding or pumping. This increases circulation and helps the milk flow. If your baby does not completely empty your breasts while breastfeeding, pump any extra milk after he or she is finished.  Wear a snug bra (nursing or regular) or tank top for 1-2 days to signal your body to slightly decrease milk production.  Apply ice packs to   your breasts, unless this is too uncomfortable for you.  Make sure that your baby is latched on and positioned properly while breastfeeding.  If engorgement persists after 48 hours of following these recommendations, contact your health care provider or a lactation consultant. Overall health care recommendations while breastfeeding  Eat healthy foods. Alternate between meals and snacks, eating 3 of each per day. Because what you eat affects your breast milk, some of the foods may make your baby more irritable than usual. Avoid eating these foods if you are sure that they are negatively affecting your baby.  Drink milk, fruit juice, and water to satisfy your thirst (about 10 glasses a day).  Rest often, relax, and continue to take your prenatal vitamins to prevent fatigue, stress, and anemia.  Continue breast self-awareness checks.  Avoid chewing and smoking tobacco. Chemicals from cigarettes that pass into breast milk and exposure to secondhand smoke may harm your baby.  Avoid alcohol and drug use, including marijuana. Some medicines that may be harmful to your baby can pass through breast milk. It is important to ask your health care provider before taking any medicine, including all over-the-counter and prescription medicine as well  as vitamin and herbal supplements. It is possible to become pregnant while breastfeeding. If birth control is desired, ask your health care provider about options that will be safe for your baby. Contact a health care provider if:  You feel like you want to stop breastfeeding or have become frustrated with breastfeeding.  You have painful breasts or nipples.  Your nipples are cracked or bleeding.  Your breasts are red, tender, or warm.  You have a swollen area on either breast.  You have a fever or chills.  You have nausea or vomiting.  You have drainage other than breast milk from your nipples.  Your breasts do not become full before feedings by the fifth day after you give birth.  You feel sad and depressed.  Your baby is too sleepy to eat well.  Your baby is having trouble sleeping.  Your baby is wetting less than 3 diapers in a 24-hour period.  Your baby has less than 3 stools in a 24-hour period.  Your baby's skin or the white part of his or her eyes becomes yellow.  Your baby is not gaining weight by 5 days of age. Get help right away if:  Your baby is overly tired (lethargic) and does not want to wake up and feed.  Your baby develops an unexplained fever. This information is not intended to replace advice given to you by your health care provider. Make sure you discuss any questions you have with your health care provider. Document Released: 04/09/2005 Document Revised: 09/21/2015 Document Reviewed: 10/01/2012 Elsevier Interactive Patient Education  2017 [employer]  

## 2016-09-11 NOTE — Addendum Note (Signed)
Addended by: Gita KudoLASSITER, Bolden Hagerman S on: 09/11/2016 02:40 PM   Modules accepted: Orders

## 2016-09-11 NOTE — Progress Notes (Signed)
Bedside TV US performed, SIUP noted with + FHR 168 and CRL measuring 7538w1d.

## 2016-09-11 NOTE — Progress Notes (Signed)
  Subjective:    Connie Webster is a G3P1011 4558w1d (by office ultrasound) being seen today for her first obstetrical visit.  Her obstetrical history is significant for advanced maternal age and asthma well controlled with albuterol. Patient does intend to breast feed. Pregnancy history fully reviewed.  Patient reports nausea.  Vitals:   09/11/16 1340 09/11/16 1356  BP: 130/83   Pulse: 80   Weight: 143 lb (64.9 kg)   Height:  5' 3.25" (1.607 m)    HISTORY: OB History  Gravida Para Term Preterm AB Living  3 1 1   1 1   SAB TAB Ectopic Multiple Live Births  1       1    # Outcome Date GA Lbr Len/2nd Weight Sex Delivery Anes PTL Lv  3 Current           2 Term 06/12/07 6641w0d  7 lb 2 oz (3.232 kg) F Vag-Spont   LIV  1 SAB 2007 3932w0d            Past Medical History:  Diagnosis Date  . Asthma    Past Surgical History:  Procedure Laterality Date  . DILATION AND CURETTAGE OF UTERUS     Family History  Problem Relation Age of Onset  . Epilepsy Father   . Epilepsy Brother      Exam    Uterus:     Pelvic Exam:    Perineum: No Hemorrhoids, Normal Perineum   Vulva: normal   Vagina:  normal mucosa, normal discharge   pH:    Cervix: multiparous appearance and cervic is closed and long   Adnexa: normal adnexa and no mass, fullness, tenderness   Bony Pelvis: gynecoid  System: Breast:  normal appearance, no masses or tenderness   Skin: normal coloration and turgor, no rashes    Neurologic: oriented, no focal deficits   Extremities: normal strength, tone, and muscle mass   HEENT extra ocular movement intact   Mouth/Teeth mucous membranes moist, pharynx normal without lesions and dental hygiene good   Neck supple and no masses   Cardiovascular: regular rate and rhythm   Respiratory:  chest clear, no wheezing, crepitations, rhonchi, normal symmetric air entry   Abdomen: soft, non-tender; bowel sounds normal; no masses,  no organomegaly   Urinary:       Assessment:    Pregnancy: G3P1011 Patient Active Problem List   Diagnosis Date Noted  . Supervision of other normal pregnancy, antepartum 09/11/2016  . AMA (advanced maternal age) multigravida 35+ 09/11/2016        Plan:     Initial labs drawn. Prenatal vitamins. Problem list reviewed and updated. Genetic Screening discussed First Screen: undecided. Patient referred to genetic counseling  Ultrasound discussed; fetal survey: requested. Will be ordered at a subsequent visit  Follow up in 4 weeks. 50% of 30 min visit spent on counseling and coordination of care.     Jaquarious Grey 09/11/2016

## 2016-09-11 NOTE — Progress Notes (Signed)
No pap on file.

## 2016-09-13 LAB — CYTOLOGY - PAP
Chlamydia: NEGATIVE
Diagnosis: NEGATIVE
HPV: NOT DETECTED
Neisseria Gonorrhea: NEGATIVE

## 2016-09-13 LAB — URINE CULTURE, OB REFLEX

## 2016-09-13 LAB — CULTURE, OB URINE

## 2016-09-18 ENCOUNTER — Other Ambulatory Visit: Payer: Self-pay | Admitting: Obstetrics and Gynecology

## 2016-09-18 MED ORDER — CEPHALEXIN 500 MG PO CAPS: 500.0000 mg | ORAL_CAPSULE | Freq: Four times a day (QID) | ORAL | 0 refills | Status: DC

## 2016-09-24 LAB — OBSTETRIC PANEL, INCLUDING HIV
Antibody Screen: NEGATIVE
Basophils Absolute: 0 10*3/uL (ref 0.0–0.2)
Basos: 0 %
EOS (ABSOLUTE): 0.2 10*3/uL (ref 0.0–0.4)
Eos: 2 %
HIV Screen 4th Generation wRfx: NONREACTIVE
Hematocrit: 41.1 % (ref 34.0–46.6)
Hemoglobin: 13.5 g/dL (ref 11.1–15.9)
Hepatitis B Surface Ag: NEGATIVE
Immature Grans (Abs): 0 10*3/uL (ref 0.0–0.1)
Immature Granulocytes: 0 %
Lymphocytes Absolute: 3.7 10*3/uL — ABNORMAL HIGH (ref 0.7–3.1)
Lymphs: 35 %
MCH: 30.3 pg (ref 26.6–33.0)
MCHC: 32.8 g/dL (ref 31.5–35.7)
MCV: 92 fL (ref 79–97)
Monocytes Absolute: 1 10*3/uL — ABNORMAL HIGH (ref 0.1–0.9)
Monocytes: 9 %
Neutrophils Absolute: 5.6 10*3/uL (ref 1.4–7.0)
Neutrophils: 54 %
Platelets: 335 10*3/uL (ref 150–379)
RBC: 4.46 x10E6/uL (ref 3.77–5.28)
RDW: 14.2 % (ref 12.3–15.4)
RPR Ser Ql: NONREACTIVE
Rh Factor: POSITIVE
Rubella Antibodies, IGG: 1.87 index (ref >0.99)
WBC: 10.6 10*3/uL (ref 3.4–10.8)

## 2016-09-24 LAB — INHERITEST SOCIETY GUIDED

## 2016-10-01 ENCOUNTER — Telehealth: Payer: Self-pay | Admitting: *Deleted

## 2016-10-01 NOTE — Telephone Encounter (Signed)
Rcvd email from [facility] - Called pt and she was at [facility] getting [facility] - she states BP in [facility] was 122/68 and thinks she may be putting BP cuff on incorrectly. Advised pt that we can show her how to apply cuff if needed. Pt expressed understanding.  *Caution - External Email* (1) IN: 2016-10-01 11:41AM SGd  Email response: hi operator,  it looks like a [facility] patient at the clinic below has critically high blood pressure.  the information is as follows:  blood pressure (original): 123/103  [facility] provider name: null null patient name: Connie Webster dob: [birth year] patient phone: [phone] edd: 2017-04-29 gestational age: [age] mrn: [phone] language: english pregnant: yes thanks!  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

## 2016-10-16 ENCOUNTER — Encounter: Payer: Self-pay | Admitting: *Deleted

## 2016-10-16 ENCOUNTER — Ambulatory Visit (INDEPENDENT_AMBULATORY_CARE_PROVIDER_SITE_OTHER): Payer: BLUE CROSS/BLUE SHIELD | Admitting: Family Medicine

## 2016-10-16 VITALS — BP 136/84 | HR 77 | Wt 139.0 lb

## 2016-10-16 DIAGNOSIS — Z3480 Encounter for supervision of other normal pregnancy, unspecified trimester: Secondary | ICD-10-CM

## 2016-10-16 DIAGNOSIS — Z3481 Encounter for supervision of other normal pregnancy, first trimester: Secondary | ICD-10-CM

## 2016-10-16 NOTE — Patient Instructions (Signed)
 First Trimester of Pregnancy The first trimester of pregnancy is from week 1 until the end of week 13 (months 1 through 3). A week after a sperm fertilizes an egg, the egg will implant on the wall of the uterus. This embryo will begin to develop into a baby. Genes from you and your partner will form the baby. The female genes will determine whether the baby will be a boy or a girl. At 6-8 weeks, the eyes and face will be formed, and the heartbeat can be seen on ultrasound. At the end of 12 weeks, all the baby's organs will be formed. Now that you are pregnant, you will want to do everything you can to have a healthy baby. Two of the most important things are to get good prenatal care and to follow your health care provider's instructions. Prenatal care is all the medical care you receive before the baby's birth. This care will help prevent, find, and treat any problems during the pregnancy and childbirth. Body changes during your first trimester Your body goes through many changes during pregnancy. The changes vary from woman to woman.  You may gain or lose a couple of pounds at first.  You may feel sick to your stomach (nauseous) and you may throw up (vomit). If the vomiting is uncontrollable, call your health care provider.  You may tire easily.  You may develop headaches that can be relieved by medicines. All medicines should be approved by your health care provider.  You may urinate more often. Painful urination may mean you have a bladder infection.  You may develop heartburn as a result of your pregnancy.  You may develop constipation because certain hormones are causing the muscles that push stool through your intestines to slow down.  You may develop hemorrhoids or swollen veins (varicose veins).  Your breasts may begin to grow larger and become tender. Your nipples may stick out more, and the tissue that surrounds them (areola) may become darker.  Your gums may bleed and may be  sensitive to brushing and flossing.  Dark spots or blotches (chloasma, mask of pregnancy) may develop on your face. This will likely fade after the baby is born.  Your menstrual periods will stop.  You may have a loss of appetite.  You may develop cravings for certain kinds of food.  You may have changes in your emotions from day to day, such as being excited to be pregnant or being concerned that something may go wrong with the pregnancy and baby.  You may have more vivid and strange dreams.  You may have changes in your hair. These can include thickening of your hair, rapid growth, and changes in texture. Some women also have hair loss during or after pregnancy, or hair that feels dry or thin. Your hair will most likely return to normal after your baby is born.  What to expect at prenatal visits During a routine prenatal visit:  You will be weighed to make sure you and the baby are growing normally.  Your blood pressure will be taken.  Your abdomen will be measured to track your baby's growth.  The fetal heartbeat will be listened to between weeks 10 and 14 of your pregnancy.  Test results from any previous visits will be discussed.  Your health care provider may ask you:  How you are feeling.  If you are feeling the baby move.  If you have had any abnormal symptoms, such as leaking fluid, bleeding, severe   headaches, or abdominal cramping.  If you are using any tobacco products, including cigarettes, chewing tobacco, and electronic cigarettes.  If you have any questions.  Other tests that may be performed during your first trimester include:  Blood tests to find your blood type and to check for the presence of any previous infections. The tests will also be used to check for low iron levels (anemia) and protein on red blood cells (Rh antibodies). Depending on your risk factors, or if you previously had diabetes during pregnancy, you may have tests to check for high blood  sugar that affects pregnant women (gestational diabetes).  Urine tests to check for infections, diabetes, or protein in the urine.  An ultrasound to confirm the proper growth and development of the baby.  Fetal screens for spinal cord problems (spina bifida) and Down syndrome.  HIV (human immunodeficiency virus) testing. Routine prenatal testing includes screening for HIV, unless you choose not to have this test.  You may need other tests to make sure you and the baby are doing well.  Follow these instructions at home: Medicines  Follow your health care provider's instructions regarding medicine use. Specific medicines may be either safe or unsafe to take during pregnancy.  Take a prenatal vitamin that contains at least 600 micrograms (mcg) of folic acid.  If you develop constipation, try taking a stool softener if your health care provider approves. Eating and drinking  Eat a balanced diet that includes fresh fruits and vegetables, whole grains, good sources of protein such as meat, eggs, or tofu, and low-fat dairy. Your health care provider will help you determine the amount of weight gain that is right for you.  Avoid raw meat and uncooked cheese. These carry germs that can cause birth defects in the baby.  Eating four or five small meals rather than three large meals a day may help relieve nausea and vomiting. If you start to feel nauseous, eating a few soda crackers can be helpful. Drinking liquids between meals, instead of during meals, also seems to help ease nausea and vomiting.  Limit foods that are high in fat and processed sugars, such as fried and sweet foods.  To prevent constipation: ? Eat foods that are high in fiber, such as fresh fruits and vegetables, whole grains, and beans. ? Drink enough fluid to keep your urine clear or pale yellow. Activity  Exercise only as directed by your health care provider. Most women can continue their usual exercise routine during  pregnancy. Try to exercise for 30 minutes at least 5 days a week. Exercising will help you: ? Control your weight. ? Stay in shape. ? Be prepared for labor and delivery.  Experiencing pain or cramping in the lower abdomen or lower back is a good sign that you should stop exercising. Check with your health care provider before continuing with normal exercises.  Try to avoid standing for long periods of time. Move your legs often if you must stand in one place for a long time.  Avoid heavy lifting.  Wear low-heeled shoes and practice good posture.  You may continue to have sex unless your health care provider tells you not to. Relieving pain and discomfort  Wear a good support bra to relieve breast tenderness.  Take warm sitz baths to soothe any pain or discomfort caused by hemorrhoids. Use hemorrhoid cream if your health care provider approves.  Rest with your legs elevated if you have leg cramps or low back pain.  If you   develop varicose veins in your legs, wear support hose. Elevate your feet for 15 minutes, 3-4 times a day. Limit salt in your diet. Prenatal care  Schedule your prenatal visits by the twelfth week of pregnancy. They are usually scheduled monthly at first, then more often in the last 2 months before delivery.  Write down your questions. Take them to your prenatal visits.  Keep all your prenatal visits as told by your health care provider. This is important. Safety  Wear your seat belt at all times when driving.  Make a list of emergency phone numbers, including numbers for family, friends, the hospital, and police and fire departments. General instructions  Ask your health care provider for a referral to a local prenatal education class. Begin classes no later than the beginning of month 6 of your pregnancy.  Ask for help if you have counseling or nutritional needs during pregnancy. Your health care provider can offer advice or refer you to specialists for help  with various needs.  Do not use hot tubs, steam rooms, or saunas.  Do not douche or use tampons or scented sanitary pads.  Do not cross your legs for long periods of time.  Avoid cat litter boxes and soil used by cats. These carry germs that can cause birth defects in the baby and possibly loss of the fetus by miscarriage or stillbirth.  Avoid all smoking, herbs, alcohol, and medicines not prescribed by your health care provider. Chemicals in these products affect the formation and growth of the baby.  Do not use any products that contain nicotine or tobacco, such as cigarettes and e-cigarettes. If you need help quitting, ask your health care provider. You may receive counseling support and other resources to help you quit.  Schedule a dentist appointment. At home, brush your teeth with a soft toothbrush and be gentle when you floss. Contact a health care provider if:  You have dizziness.  You have mild pelvic cramps, pelvic pressure, or nagging pain in the abdominal area.  You have persistent nausea, vomiting, or diarrhea.  You have a bad smelling vaginal discharge.  You have pain when you urinate.  You notice increased swelling in your face, hands, legs, or ankles.  You are exposed to fifth disease or chickenpox.  You are exposed to German measles (rubella) and have never had it. Get help right away if:  You have a fever.  You are leaking fluid from your vagina.  You have spotting or bleeding from your vagina.  You have severe abdominal cramping or pain.  You have rapid weight gain or loss.  You vomit blood or material that looks like coffee grounds.  You develop a severe headache.  You have shortness of breath.  You have any kind of trauma, such as from a fall or a car accident. Summary  The first trimester of pregnancy is from week 1 until the end of week 13 (months 1 through 3).  Your body goes through many changes during pregnancy. The changes vary from  woman to woman.  You will have routine prenatal visits. During those visits, your health care provider will examine you, discuss any test results you may have, and talk with you about how you are feeling. This information is not intended to replace advice given to you by your health care provider. Make sure you discuss any questions you have with your health care provider. Document Released: 04/03/2001 Document Revised: 03/21/2016 Document Reviewed: 03/21/2016 [employer] Interactive Patient Education  2017   [employer]   Breastfeeding Deciding to breastfeed is one of the best choices you can make for you and your baby. A change in hormones during pregnancy causes your breast tissue to grow and increases the number and size of your milk ducts. These hormones also allow proteins, sugars, and fats from your blood supply to make breast milk in your milk-producing glands. Hormones prevent breast milk from being released before your baby is born as well as prompt milk flow after birth. Once breastfeeding has begun, thoughts of your baby, as well as his or her sucking or crying, can stimulate the release of milk from your milk-producing glands. Benefits of breastfeeding For Your Baby  Your first milk (colostrum) helps your baby's digestive system function better.  There are antibodies in your milk that help your baby fight off infections.  Your baby has a lower incidence of asthma, allergies, and sudden infant death syndrome.  The nutrients in breast milk are better for your baby than infant formulas and are designed uniquely for your baby's needs.  Breast milk improves your baby's brain development.  Your baby is less likely to develop other conditions, such as childhood obesity, asthma, or type 2 diabetes mellitus.  For You  Breastfeeding helps to create a very special bond between you and your baby.  Breastfeeding is convenient. Breast milk is always available at the correct temperature and  costs nothing.  Breastfeeding helps to burn calories and helps you lose the weight gained during pregnancy.  Breastfeeding makes your uterus contract to its prepregnancy size faster and slows bleeding (lochia) after you give birth.  Breastfeeding helps to lower your risk of developing type 2 diabetes mellitus, osteoporosis, and breast or ovarian cancer later in life.  Signs that your baby is hungry Early Signs of Hunger  Increased alertness or activity.  Stretching.  Movement of the head from side to side.  Movement of the head and opening of the mouth when the corner of the mouth or cheek is stroked (rooting).  Increased sucking sounds, smacking lips, cooing, sighing, or squeaking.  Hand-to-mouth movements.  Increased sucking of fingers or hands.  Late Signs of Hunger  Fussing.  Intermittent crying.  Extreme Signs of Hunger Signs of extreme hunger will require calming and consoling before your baby will be able to breastfeed successfully. Do not wait for the following signs of extreme hunger to occur before you initiate breastfeeding:  Restlessness.  A loud, strong cry.  Screaming.  Breastfeeding basics Breastfeeding Initiation  Find a comfortable place to sit or lie down, with your neck and back well supported.  Place a pillow or rolled up blanket under your baby to bring him or her to the level of your breast (if you are seated). Nursing pillows are specially designed to help support your arms and your baby while you breastfeed.  Make sure that your baby's abdomen is facing your abdomen.  Gently massage your breast. With your fingertips, massage from your chest wall toward your nipple in a circular motion. This encourages milk flow. You may need to continue this action during the feeding if your milk flows slowly.  Support your breast with 4 fingers underneath and your thumb above your nipple. Make sure your fingers are well away from your nipple and your baby's  mouth.  Stroke your baby's lips gently with your finger or nipple.  When your baby's mouth is open wide enough, quickly bring your baby to your breast, placing your entire nipple and as   much of the colored area around your nipple (areola) as possible into your baby's mouth. ? More areola should be visible above your baby's upper lip than below the lower lip. ? Your baby's tongue should be between his or her lower gum and your breast.  Ensure that your baby's mouth is correctly positioned around your nipple (latched). Your baby's lips should create a seal on your breast and be turned out (everted).  It is common for your baby to suck about 2-3 minutes in order to start the flow of breast milk.  Latching Teaching your baby how to latch on to your breast properly is very important. An improper latch can cause nipple pain and decreased milk supply for you and poor weight gain in your baby. Also, if your baby is not latched onto your nipple properly, he or she may swallow some air during feeding. This can make your baby fussy. Burping your baby when you switch breasts during the feeding can help to get rid of the air. However, teaching your baby to latch on properly is still the best way to prevent fussiness from swallowing air while breastfeeding. Signs that your baby has successfully latched on to your nipple:  Silent tugging or silent sucking, without causing you pain.  Swallowing heard between every 3-4 sucks.  Muscle movement above and in front of his or her ears while sucking.  Signs that your baby has not successfully latched on to nipple:  Sucking sounds or smacking sounds from your baby while breastfeeding.  Nipple pain.  If you think your baby has not latched on correctly, slip your finger into the corner of your baby's mouth to break the suction and place it between your baby's gums. Attempt breastfeeding initiation again. Signs of Successful Breastfeeding Signs from your  baby:  A gradual decrease in the number of sucks or complete cessation of sucking.  Falling asleep.  Relaxation of his or her body.  Retention of a small amount of milk in his or her mouth.  Letting go of your breast by himself or herself.  Signs from you:  Breasts that have increased in firmness, weight, and size 1-3 hours after feeding.  Breasts that are softer immediately after breastfeeding.  Increased milk volume, as well as a change in milk consistency and color by the fifth day of breastfeeding.  Nipples that are not sore, cracked, or bleeding.  Signs That Your Baby is Getting Enough Milk  Wetting at least 1-2 diapers during the first 24 hours after birth.  Wetting at least 5-6 diapers every 24 hours for the first week after birth. The urine should be clear or pale yellow by 5 days after birth.  Wetting 6-8 diapers every 24 hours as your baby continues to grow and develop.  At least 3 stools in a 24-hour period by age 5 days. The stool should be soft and yellow.  At least 3 stools in a 24-hour period by age 7 days. The stool should be seedy and yellow.  No loss of weight greater than 10% of birth weight during the first 3 days of age.  Average weight gain of 4-7 ounces (113-198 g) per week after age 4 days.  Consistent daily weight gain by age 5 days, without weight loss after the age of 2 weeks.  After a feeding, your baby may spit up a small amount. This is common. Breastfeeding frequency and duration Frequent feeding will help you make more milk and can prevent sore nipples and   breast engorgement. Breastfeed when you feel the need to reduce the fullness of your breasts or when your baby shows signs of hunger. This is called "breastfeeding on demand." Avoid introducing a pacifier to your baby while you are working to establish breastfeeding (the first 4-6 weeks after your baby is born). After this time you may choose to use a pacifier. Research has shown that  pacifier use during the first year of a baby's life decreases the risk of sudden infant death syndrome (SIDS). Allow your baby to feed on each breast as long as he or she wants. Breastfeed until your baby is finished feeding. When your baby unlatches or falls asleep while feeding from the first breast, offer the second breast. Because newborns are often sleepy in the first few weeks of life, you may need to awaken your baby to get him or her to feed. Breastfeeding times will vary from baby to baby. However, the following rules can serve as a guide to help you ensure that your baby is properly fed:  Newborns (babies 4 weeks of age or younger) may breastfeed every 1-3 hours.  Newborns should not go longer than 3 hours during the day or 5 hours during the night without breastfeeding.  You should breastfeed your baby a minimum of 8 times in a 24-hour period until you begin to introduce solid foods to your baby at around 6 months of age.  Breast milk pumping Pumping and storing breast milk allows you to ensure that your baby is exclusively fed your breast milk, even at times when you are unable to breastfeed. This is especially important if you are going back to work while you are still breastfeeding or when you are not able to be present during feedings. Your lactation consultant can give you guidelines on how long it is safe to store breast milk. A breast pump is a machine that allows you to pump milk from your breast into a sterile bottle. The pumped breast milk can then be stored in a refrigerator or freezer. Some breast pumps are operated by hand, while others use electricity. Ask your lactation consultant which type will work best for you. Breast pumps can be purchased, but some hospitals and breastfeeding support groups lease breast pumps on a monthly basis. A lactation consultant can teach you how to hand express breast milk, if you prefer not to use a pump. Caring for your breasts while you  breastfeed Nipples can become dry, cracked, and sore while breastfeeding. The following recommendations can help keep your breasts moisturized and healthy:  Avoid using soap on your nipples.  Wear a supportive bra. Although not required, special nursing bras and tank tops are designed to allow access to your breasts for breastfeeding without taking off your entire bra or top. Avoid wearing underwire-style bras or extremely tight bras.  Air dry your nipples for 3-4minutes after each feeding.  Use only cotton bra pads to absorb leaked breast milk. Leaking of breast milk between feedings is normal.  Use lanolin on your nipples after breastfeeding. Lanolin helps to maintain your skin's normal moisture barrier. If you use pure lanolin, you do not need to wash it off before feeding your baby again. Pure lanolin is not toxic to your baby. You may also hand express a few drops of breast milk and gently massage that milk into your nipples and allow the milk to air dry.  In the first few weeks after giving birth, some women experience extremely full breasts (engorgement).   Engorgement can make your breasts feel heavy, warm, and tender to the touch. Engorgement peaks within 3-5 days after you give birth. The following recommendations can help ease engorgement:  Completely empty your breasts while breastfeeding or pumping. You may want to start by applying warm, moist heat (in the shower or with warm water-soaked hand towels) just before feeding or pumping. This increases circulation and helps the milk flow. If your baby does not completely empty your breasts while breastfeeding, pump any extra milk after he or she is finished.  Wear a snug bra (nursing or regular) or tank top for 1-2 days to signal your body to slightly decrease milk production.  Apply ice packs to your breasts, unless this is too uncomfortable for you.  Make sure that your baby is latched on and positioned properly while  breastfeeding.  If engorgement persists after 48 hours of following these recommendations, contact your health care provider or a lactation consultant. Overall health care recommendations while breastfeeding  Eat healthy foods. Alternate between meals and snacks, eating 3 of each per day. Because what you eat affects your breast milk, some of the foods may make your baby more irritable than usual. Avoid eating these foods if you are sure that they are negatively affecting your baby.  Drink milk, fruit juice, and water to satisfy your thirst (about 10 glasses a day).  Rest often, relax, and continue to take your prenatal vitamins to prevent fatigue, stress, and anemia.  Continue breast self-awareness checks.  Avoid chewing and smoking tobacco. Chemicals from cigarettes that pass into breast milk and exposure to secondhand smoke may harm your baby.  Avoid alcohol and drug use, including marijuana. Some medicines that may be harmful to your baby can pass through breast milk. It is important to ask your health care provider before taking any medicine, including all over-the-counter and prescription medicine as well as vitamin and herbal supplements. It is possible to become pregnant while breastfeeding. If birth control is desired, ask your health care provider about options that will be safe for your baby. Contact a health care provider if:  You feel like you want to stop breastfeeding or have become frustrated with breastfeeding.  You have painful breasts or nipples.  Your nipples are cracked or bleeding.  Your breasts are red, tender, or warm.  You have a swollen area on either breast.  You have a fever or chills.  You have nausea or vomiting.  You have drainage other than breast milk from your nipples.  Your breasts do not become full before feedings by the fifth day after you give birth.  You feel sad and depressed.  Your baby is too sleepy to eat well.  Your baby is having  trouble sleeping.  Your baby is wetting less than 3 diapers in a 24-hour period.  Your baby has less than 3 stools in a 24-hour period.  Your baby's skin or the white part of his or her eyes becomes yellow.  Your baby is not gaining weight by 5 days of age. Get help right away if:  Your baby is overly tired (lethargic) and does not want to wake up and feed.  Your baby develops an unexplained fever. This information is not intended to replace advice given to you by your health care provider. Make sure you discuss any questions you have with your health care provider. Document Released: 04/09/2005 Document Revised: 09/21/2015 Document Reviewed: 10/01/2012 [employer] Interactive Patient Education  2017 [employer]  

## 2016-10-16 NOTE — Progress Notes (Signed)
   PRENATAL VISIT NOTE  Subjective:  Connie Webster is a 36 y.o. G3P1011 at 10092w1d being seen today for ongoing prenatal care.  She is currently monitored for the following issues for this low-risk pregnancy and has Supervision of other normal pregnancy, antepartum; AMA (advanced maternal age) multigravida 35+; and Asthma affecting pregnancy, antepartum on her problem list.  Patient reports no complaints.  Contractions: Not present. Vag. Bleeding: None.  Movement: Absent. Denies leaking of fluid.   The following portions of the patient's history were reviewed and updated as appropriate: allergies, current medications, past family history, past medical history, past social history, past surgical history and problem list. Problem list updated.  Objective:   Vitals:   10/16/16 1019  BP: 136/84  Pulse: 77  Weight: 139 lb (63 kg)    Fetal Status: Fetal Heart Rate (bpm): 154   Movement: Absent     General:  Alert, oriented and cooperative. Patient is in no acute distress.  Skin: Skin is warm and dry. No rash noted.   Cardiovascular: Normal heart rate noted  Respiratory: Normal respiratory effort, no problems with respiration noted  Abdomen: Soft, gravid, appropriate for gestational age. Pain/Pressure: Present     Pelvic:  Cervical exam deferred        Extremities: Normal range of motion.  Edema: None  Mental Status: Normal mood and affect. Normal behavior. Normal judgment and thought content.   Assessment and Plan:  Pregnancy: G3P1011 at 7592w1d  1. Supervision of other normal pregnancy, antepartum Continue routine prenatal care.   General obstetric precautions including but not limited to vaginal bleeding, contractions, leaking of fluid and fetal movement were reviewed in detail with the patient. Please refer to After Visit Summary for other counseling recommendations.  Return in about 4 weeks (around 11/13/2016) for ob visit.   Reva Boresanya S Pratt, MD

## 2016-11-16 ENCOUNTER — Encounter: Payer: BLUE CROSS/BLUE SHIELD | Admitting: Family Medicine

## 2016-11-19 ENCOUNTER — Emergency Department
Admission: EM | Admit: 2016-11-19 | Discharge: 2016-11-19 | Discharge status: Against Medical Advice | Payer: BLUE CROSS/BLUE SHIELD

## 2016-11-19 ENCOUNTER — Ambulatory Visit (INDEPENDENT_AMBULATORY_CARE_PROVIDER_SITE_OTHER): Payer: BLUE CROSS/BLUE SHIELD | Admitting: Obstetrics and Gynecology

## 2016-11-19 VITALS — BP 133/79 | HR 76 | Wt 140.0 lb

## 2016-11-19 DIAGNOSIS — J45909 Unspecified asthma, uncomplicated: Secondary | ICD-10-CM

## 2016-11-19 DIAGNOSIS — O219 Vomiting of pregnancy, unspecified: Secondary | ICD-10-CM

## 2016-11-19 DIAGNOSIS — O99519 Diseases of the respiratory system complicating pregnancy, unspecified trimester: Secondary | ICD-10-CM

## 2016-11-19 DIAGNOSIS — O09522 Supervision of elderly multigravida, second trimester: Secondary | ICD-10-CM

## 2016-11-19 DIAGNOSIS — O99512 Diseases of the respiratory system complicating pregnancy, second trimester: Secondary | ICD-10-CM

## 2016-11-19 MED ORDER — DOXYLAMINE-PYRIDOXINE ER 20-20 MG PO TBCR: 1.0000 | EXTENDED_RELEASE_TABLET | Freq: Two times a day (BID) | ORAL | 3 refills | Status: DC

## 2016-11-19 MED ORDER — DOXYLAMINE-PYRIDOXINE 10-10 MG PO TBEC: DELAYED_RELEASE_TABLET | ORAL | 6 refills | Status: DC

## 2016-11-19 MED ORDER — FAMOTIDINE 20 MG PO TABS: 20.0000 mg | ORAL_TABLET | Freq: Two times a day (BID) | ORAL | 3 refills | Status: DC

## 2016-11-19 NOTE — Progress Notes (Signed)
Prenatal Visit Note Date: 11/19/2016 [facility]  Subjective:  Connie Webster is a 36 y.o. G3P1011 at 3374w0d being seen today for ongoing prenatal care.  She is currently monitored for the following issues for this high-risk pregnancy and has Supervision of high risk pregnancy, antepartum, second trimester; AMA (advanced maternal age) multigravida 35+; and Asthma affecting pregnancy, antepartum on her problem list.  Patient reports nausea and some vomiting..   Contractions: Not present. Vag. Bleeding: Scant.  Movement: Absent. Denies leaking of fluid.   The following portions of the patient's history were reviewed and updated as appropriate: allergies, current medications, past family history, past medical history, past social history, past surgical history and problem list. Problem list updated.  Objective:   Vitals:   11/19/16 1349  BP: 133/79  Pulse: 76  Weight: 140 lb (63.5 kg)    Fetal Status: Fetal Heart Rate (bpm): 153   Movement: Absent     General:  Alert, oriented and cooperative. Patient is in no acute distress.  Skin: Skin is warm and dry. No rash noted.   Cardiovascular: Normal heart rate noted  Respiratory: Normal respiratory effort, no problems with respiration noted  Abdomen: Soft, gravid, appropriate for gestational age. Pain/Pressure: Present     Pelvic:  Cervical exam deferred        Extremities: Normal range of motion.  Edema: Trace  Mental Status: Normal mood and affect. Normal behavior. Normal judgment and thought content.   Urinalysis:      Assessment and Plan:  Pregnancy: G3P1011 at 8374w0d  1. Nausea and vomiting during pregnancy prior to [redacted] weeks gestation D/w her re: behavioral interventions. Bonjesta given. No gerd s/s but advised to start something to see if it helps. Pt amenable to ppi. Weight unchanged from pre preg weight.   2. Elderly multigravida in second trimester Set up for anatomy u/s.  Options d/w her; she declines formal GC or any screening - US MFM OB DETAIL +14 WK; Future  3. Asthma affecting pregnancy, antepartum No meds.   Preterm labor symptoms and general obstetric precautions including but not limited to vaginal bleeding, contractions, leaking of fluid and fetal movement were reviewed in detail with the patient. Please refer to After Visit Summary for other counseling recommendations.  Return in about 3 weeks (around 12/10/2016) for rob.   Hollister BingPickens, Reford Olliff, MD

## 2016-11-19 NOTE — Progress Notes (Signed)
Bonjesta samples given

## 2016-12-05 ENCOUNTER — Ambulatory Visit (HOSPITAL_COMMUNITY)
Admission: RE | Admit: 2016-12-05 | Discharge: 2016-12-05 | Disposition: A | Discharge status: Home / Self Care | Payer: BLUE CROSS/BLUE SHIELD | Source: Ambulatory Visit | Attending: Obstetrics and Gynecology | Admitting: Obstetrics and Gynecology

## 2016-12-05 DIAGNOSIS — O09522 Supervision of elderly multigravida, second trimester: Secondary | ICD-10-CM

## 2016-12-05 DIAGNOSIS — O9989 Other specified diseases and conditions complicating pregnancy, childbirth and the puerperium: Secondary | ICD-10-CM | POA: Diagnosis not present

## 2016-12-05 DIAGNOSIS — Z3689 Encounter for other specified antenatal screening: Secondary | ICD-10-CM | POA: Insufficient documentation

## 2016-12-05 DIAGNOSIS — J45909 Unspecified asthma, uncomplicated: Secondary | ICD-10-CM | POA: Insufficient documentation

## 2016-12-05 DIAGNOSIS — Z3A19 19 weeks gestation of pregnancy: Secondary | ICD-10-CM | POA: Insufficient documentation

## 2016-12-11 ENCOUNTER — Ambulatory Visit (INDEPENDENT_AMBULATORY_CARE_PROVIDER_SITE_OTHER): Payer: BLUE CROSS/BLUE SHIELD | Admitting: Obstetrics & Gynecology

## 2016-12-11 VITALS — BP 117/77 | HR 80 | Wt 144.4 lb

## 2016-12-11 DIAGNOSIS — O09522 Supervision of elderly multigravida, second trimester: Secondary | ICD-10-CM

## 2016-12-11 DIAGNOSIS — O0992 Supervision of high risk pregnancy, unspecified, second trimester: Secondary | ICD-10-CM

## 2016-12-11 NOTE — Progress Notes (Signed)
Patient denies concerns at this time.

## 2016-12-11 NOTE — Progress Notes (Signed)
   PRENATAL VISIT NOTE  Subjective:  Connie Webster is a 36 y.o. G3P1011 at [redacted]w[redacted]d being seen today for ongoing prenatal care.  She is currently monitored for the following issues for this low-risk pregnancy and has Supervision of high risk pregnancy, antepartum, second trimester; AMA (advanced maternal age) multigravida 35+; and Asthma affecting pregnancy, antepartum on her problem list.  Patient reports no complaints.  Contractions: Not present. Vag. Bleeding: None.  Movement: Present. Denies leaking of fluid.   The following portions of the patient's history were reviewed and updated as appropriate: allergies, current medications, past family history, past medical history, past social history, past surgical history and problem list. Problem list updated.  Objective:   Vitals:   12/11/16 0908  BP: (!) 148/72  Pulse: 80  Weight: 144 lb 6.4 oz (65.5 kg)    Fetal Status: Fetal Heart Rate (bpm): 140   Movement: Present     General:  Alert, oriented and cooperative. Patient is in no acute distress.  Skin: Skin is warm and dry. No rash noted.   Cardiovascular: Normal heart rate noted  Respiratory: Normal respiratory effort, no problems with respiration noted  Abdomen: Soft, gravid, appropriate for gestational age.  Pain/Pressure: Present     Pelvic: Cervical exam deferred        Extremities: Normal range of motion.  Edema: None  Mental Status:  Normal mood and affect. Normal behavior. Normal judgment and thought content.   Assessment and Plan:  Pregnancy: G3P1011 at [redacted]w[redacted]d  1. Elderly multigravida in second trimester   2. Supervision of high risk pregnancy, antepartum, second trimester   Preterm labor symptoms and general obstetric precautions including but not limited to vaginal bleeding, contractions, leaking of fluid and fetal movement were reviewed in detail with the patient. Please refer to After Visit Summary for other counseling recommendations.  No Follow-up on  file.   Allie Bossier, MD

## 2017-01-08 ENCOUNTER — Ambulatory Visit (INDEPENDENT_AMBULATORY_CARE_PROVIDER_SITE_OTHER): Payer: BLUE CROSS/BLUE SHIELD | Admitting: Family Medicine

## 2017-01-08 VITALS — BP 124/81 | HR 79 | Wt 144.4 lb

## 2017-01-08 DIAGNOSIS — O09522 Supervision of elderly multigravida, second trimester: Secondary | ICD-10-CM

## 2017-01-08 DIAGNOSIS — O99519 Diseases of the respiratory system complicating pregnancy, unspecified trimester: Secondary | ICD-10-CM

## 2017-01-08 DIAGNOSIS — O0992 Supervision of high risk pregnancy, unspecified, second trimester: Secondary | ICD-10-CM

## 2017-01-08 DIAGNOSIS — J45909 Unspecified asthma, uncomplicated: Secondary | ICD-10-CM

## 2017-01-08 NOTE — Progress Notes (Signed)
   PRENATAL VISIT NOTE  Subjective:  Connie Webster is a 36 y.o. G3P1011 at [redacted]w[redacted]d being seen today for ongoing prenatal care.  She is currently monitored for the following issues for this high-risk pregnancy and has Supervision of high risk pregnancy, antepartum, second trimester; AMA (advanced maternal age) multigravida 35+; and Asthma affecting pregnancy, antepartum on her problem list.  Patient reports no complaints.  Contractions: Not present.  .  Movement: Present. Denies leaking of fluid.   The following portions of the patient's history were reviewed and updated as appropriate: allergies, current medications, past family history, past medical history, past social history, past surgical history and problem list. Problem list updated.  Objective:   Vitals:   01/08/17 0926  BP: 124/81  Pulse: 79  Weight: 144 lb 6.4 oz (65.5 kg)    Fetal Status: Fetal Heart Rate (bpm): 135   Movement: Present     General:  Alert, oriented and cooperative. Patient is in no acute distress.  Skin: Skin is warm and dry. No rash noted.   Cardiovascular: Normal heart rate noted  Respiratory: Normal respiratory effort, no problems with respiration noted  Abdomen: Soft, gravid, appropriate for gestational age.  Pain/Pressure: Absent     Pelvic: Cervical exam deferred        Extremities: Normal range of motion.  Edema: None  Mental Status:  Normal mood and affect. Normal behavior. Normal judgment and thought content.   Assessment and Plan:  Pregnancy: G3P1011 at [redacted]w[redacted]d  1. Elderly multigravida in second trimester Genetic screening declined  2. Supervision of high risk pregnancy, antepartum, second trimester Up to date Discussed model of care  Reviewed gtt at next visit  3. Asthma affecting pregnancy, antepartum No sx  Preterm labor symptoms and general obstetric precautions including but not limited to vaginal bleeding, contractions, leaking of fluid and fetal movement were reviewed  in detail with the patient. Please refer to After Visit Summary for other counseling recommendations.   Return in about 4 weeks (around 02/05/2017) for Routine prenatal care- 2hr gtt at next visit.   Federico Flake, MD

## 2017-01-08 NOTE — Patient Instructions (Signed)
Breastfeeding Deciding to breastfeed is one of the best choices you can make for you and your baby. A change in hormones during pregnancy causes your breast tissue to grow and increases the number and size of your milk ducts. These hormones also allow proteins, sugars, and fats from your blood supply to make breast milk in your milk-producing glands. Hormones prevent breast milk from being released before your baby is born as well as prompt milk flow after birth. Once breastfeeding has begun, thoughts of your baby, as well as his or her sucking or crying, can stimulate the release of milk from your milk-producing glands. Benefits of breastfeeding For Your Baby  Your first milk (colostrum) helps your baby's digestive system function better.  There are antibodies in your milk that help your baby fight off infections.  Your baby has a lower incidence of asthma, allergies, and sudden infant death syndrome.  The nutrients in breast milk are better for your baby than infant formulas and are designed uniquely for your baby's needs.  Breast milk improves your baby's brain development.  Your baby is less likely to develop other conditions, such as childhood obesity, asthma, or type 2 diabetes mellitus.  For You  Breastfeeding helps to create a very special bond between you and your baby.  Breastfeeding is convenient. Breast milk is always available at the correct temperature and costs nothing.  Breastfeeding helps to burn calories and helps you lose the weight gained during pregnancy.  Breastfeeding makes your uterus contract to its prepregnancy size faster and slows bleeding (lochia) after you give birth.  Breastfeeding helps to lower your risk of developing type 2 diabetes mellitus, osteoporosis, and breast or ovarian cancer later in life.  Signs that your baby is hungry Early Signs of Hunger  Increased alertness or activity.  Stretching.  Movement of the head from side to  side.  Movement of the head and opening of the mouth when the corner of the mouth or cheek is stroked (rooting).  Increased sucking sounds, smacking lips, cooing, sighing, or squeaking.  Hand-to-mouth movements.  Increased sucking of fingers or hands.  Late Signs of Hunger  Fussing.  Intermittent crying.  Extreme Signs of Hunger Signs of extreme hunger will require calming and consoling before your baby will be able to breastfeed successfully. Do not wait for the following signs of extreme hunger to occur before you initiate breastfeeding:  Restlessness.  A loud, strong cry.  Screaming.  Breastfeeding basics Breastfeeding Initiation  Find a comfortable place to sit or lie down, with your neck and back well supported.  Place a pillow or rolled up blanket under your baby to bring him or her to the level of your breast (if you are seated). Nursing pillows are specially designed to help support your arms and your baby while you breastfeed.  Make sure that your baby's abdomen is facing your abdomen.  Gently massage your breast. With your fingertips, massage from your chest wall toward your nipple in a circular motion. This encourages milk flow. You may need to continue this action during the feeding if your milk flows slowly.  Support your breast with 4 fingers underneath and your thumb above your nipple. Make sure your fingers are well away from your nipple and your baby's mouth.  Stroke your baby's lips gently with your finger or nipple.  When your baby's mouth is open wide enough, quickly bring your baby to your breast, placing your entire nipple and as much of the colored area   around your nipple (areola) as possible into your baby's mouth. ? More areola should be visible above your baby's upper lip than below the lower lip. ? Your baby's tongue should be between his or her lower gum and your breast.  Ensure that your baby's mouth is correctly positioned around your nipple  (latched). Your baby's lips should create a seal on your breast and be turned out (everted).  It is common for your baby to suck about 2-3 minutes in order to start the flow of breast milk.  Latching Teaching your baby how to latch on to your breast properly is very important. An improper latch can cause nipple pain and decreased milk supply for you and poor weight gain in your baby. Also, if your baby is not latched onto your nipple properly, he or she may swallow some air during feeding. This can make your baby fussy. Burping your baby when you switch breasts during the feeding can help to get rid of the air. However, teaching your baby to latch on properly is still the best way to prevent fussiness from swallowing air while breastfeeding. Signs that your baby has successfully latched on to your nipple:  Silent tugging or silent sucking, without causing you pain.  Swallowing heard between every 3-4 sucks.  Muscle movement above and in front of his or her ears while sucking.  Signs that your baby has not successfully latched on to nipple:  Sucking sounds or smacking sounds from your baby while breastfeeding.  Nipple pain.  If you think your baby has not latched on correctly, slip your finger into the corner of your baby's mouth to break the suction and place it between your baby's gums. Attempt breastfeeding initiation again. Signs of Successful Breastfeeding Signs from your baby:  A gradual decrease in the number of sucks or complete cessation of sucking.  Falling asleep.  Relaxation of his or her body.  Retention of a small amount of milk in his or her mouth.  Letting go of your breast by himself or herself.  Signs from you:  Breasts that have increased in firmness, weight, and size 1-3 hours after feeding.  Breasts that are softer immediately after breastfeeding.  Increased milk volume, as well as a change in milk consistency and color by the fifth day of  breastfeeding.  Nipples that are not sore, cracked, or bleeding.  Signs That Your Baby is Getting Enough Milk  Wetting at least 1-2 diapers during the first 24 hours after birth.  Wetting at least 5-6 diapers every 24 hours for the first week after birth. The urine should be clear or pale yellow by 5 days after birth.  Wetting 6-8 diapers every 24 hours as your baby continues to grow and develop.  At least 3 stools in a 24-hour period by age 5 days. The stool should be soft and yellow.  At least 3 stools in a 24-hour period by age 7 days. The stool should be seedy and yellow.  No loss of weight greater than 10% of birth weight during the first 3 days of age.  Average weight gain of 4-7 ounces (113-198 g) per week after age 4 days.  Consistent daily weight gain by age 5 days, without weight loss after the age of 2 weeks.  After a feeding, your baby may spit up a small amount. This is common. Breastfeeding frequency and duration Frequent feeding will help you make more milk and can prevent sore nipples and breast engorgement. Breastfeed when   you feel the need to reduce the fullness of your breasts or when your baby shows signs of hunger. This is called "breastfeeding on demand." Avoid introducing a pacifier to your baby while you are working to establish breastfeeding (the first 4-6 weeks after your baby is born). After this time you may choose to use a pacifier. Research has shown that pacifier use during the first year of a baby's life decreases the risk of sudden infant death syndrome (SIDS). Allow your baby to feed on each breast as long as he or she wants. Breastfeed until your baby is finished feeding. When your baby unlatches or falls asleep while feeding from the first breast, offer the second breast. Because newborns are often sleepy in the first few weeks of life, you may need to awaken your baby to get him or her to feed. Breastfeeding times will vary from baby to baby. However,  the following rules can serve as a guide to help you ensure that your baby is properly fed:  Newborns (babies 4 weeks of age or younger) may breastfeed every 1-3 hours.  Newborns should not go longer than 3 hours during the day or 5 hours during the night without breastfeeding.  You should breastfeed your baby a minimum of 8 times in a 24-hour period until you begin to introduce solid foods to your baby at around 6 months of age.  Breast milk pumping Pumping and storing breast milk allows you to ensure that your baby is exclusively fed your breast milk, even at times when you are unable to breastfeed. This is especially important if you are going back to work while you are still breastfeeding or when you are not able to be present during feedings. Your lactation consultant can give you guidelines on how long it is safe to store breast milk. A breast pump is a machine that allows you to pump milk from your breast into a sterile bottle. The pumped breast milk can then be stored in a refrigerator or freezer. Some breast pumps are operated by hand, while others use electricity. Ask your lactation consultant which type will work best for you. Breast pumps can be purchased, but some hospitals and breastfeeding support groups lease breast pumps on a monthly basis. A lactation consultant can teach you how to hand express breast milk, if you prefer not to use a pump. Caring for your breasts while you breastfeed Nipples can become dry, cracked, and sore while breastfeeding. The following recommendations can help keep your breasts moisturized and healthy:  Avoid using soap on your nipples.  Wear a supportive bra. Although not required, special nursing bras and tank tops are designed to allow access to your breasts for breastfeeding without taking off your entire bra or top. Avoid wearing underwire-style bras or extremely tight bras.  Air dry your nipples for 3-4minutes after each feeding.  Use only cotton  bra pads to absorb leaked breast milk. Leaking of breast milk between feedings is normal.  Use lanolin on your nipples after breastfeeding. Lanolin helps to maintain your skin's normal moisture barrier. If you use pure lanolin, you do not need to wash it off before feeding your baby again. Pure lanolin is not toxic to your baby. You may also hand express a few drops of breast milk and gently massage that milk into your nipples and allow the milk to air dry.  In the first few weeks after giving birth, some women experience extremely full breasts (engorgement). Engorgement can make your   breasts feel heavy, warm, and tender to the touch. Engorgement peaks within 3-5 days after you give birth. The following recommendations can help ease engorgement:  Completely empty your breasts while breastfeeding or pumping. You may want to start by applying warm, moist heat (in the shower or with warm water-soaked hand towels) just before feeding or pumping. This increases circulation and helps the milk flow. If your baby does not completely empty your breasts while breastfeeding, pump any extra milk after he or she is finished.  Wear a snug bra (nursing or regular) or tank top for 1-2 days to signal your body to slightly decrease milk production.  Apply ice packs to your breasts, unless this is too uncomfortable for you.  Make sure that your baby is latched on and positioned properly while breastfeeding.  If engorgement persists after 48 hours of following these recommendations, contact your health care provider or a lactation consultant. Overall health care recommendations while breastfeeding  Eat healthy foods. Alternate between meals and snacks, eating 3 of each per day. Because what you eat affects your breast milk, some of the foods may make your baby more irritable than usual. Avoid eating these foods if you are sure that they are negatively affecting your baby.  Drink milk, fruit juice, and water to  satisfy your thirst (about 10 glasses a day).  Rest often, relax, and continue to take your prenatal vitamins to prevent fatigue, stress, and anemia.  Continue breast self-awareness checks.  Avoid chewing and smoking tobacco. Chemicals from cigarettes that pass into breast milk and exposure to secondhand smoke may harm your baby.  Avoid alcohol and drug use, including marijuana. Some medicines that may be harmful to your baby can pass through breast milk. It is important to ask your health care provider before taking any medicine, including all over-the-counter and prescription medicine as well as vitamin and herbal supplements. It is possible to become pregnant while breastfeeding. If birth control is desired, ask your health care provider about options that will be safe for your baby. Contact a health care provider if:  You feel like you want to stop breastfeeding or have become frustrated with breastfeeding.  You have painful breasts or nipples.  Your nipples are cracked or bleeding.  Your breasts are red, tender, or warm.  You have a swollen area on either breast.  You have a fever or chills.  You have nausea or vomiting.  You have drainage other than breast milk from your nipples.  Your breasts do not become full before feedings by the fifth day after you give birth.  You feel sad and depressed.  Your baby is too sleepy to eat well.  Your baby is having trouble sleeping.  Your baby is wetting less than 3 diapers in a 24-hour period.  Your baby has less than 3 stools in a 24-hour period.  Your baby's skin or the white part of his or her eyes becomes yellow.  Your baby is not gaining weight by 5 days of age. Get help right away if:  Your baby is overly tired (lethargic) and does not want to wake up and feed.  Your baby develops an unexplained fever. This information is not intended to replace advice given to you by your health care provider. Make sure you discuss  any questions you have with your health care provider. Document Released: 04/09/2005 Document Revised: 09/21/2015 Document Reviewed: 10/01/2012 [employer] Interactive Patient Education  2017 [employer]  

## 2017-01-16 ENCOUNTER — Inpatient Hospital Stay (HOSPITAL_COMMUNITY)
Admission: AD | Admit: 2017-01-16 | Payer: BLUE CROSS/BLUE SHIELD | Source: Ambulatory Visit | Admitting: Family Medicine

## 2017-02-05 ENCOUNTER — Ambulatory Visit (INDEPENDENT_AMBULATORY_CARE_PROVIDER_SITE_OTHER): Payer: BLUE CROSS/BLUE SHIELD | Admitting: Obstetrics & Gynecology

## 2017-02-05 VITALS — BP 128/85 | HR 89 | Wt 149.5 lb

## 2017-02-05 DIAGNOSIS — O09523 Supervision of elderly multigravida, third trimester: Secondary | ICD-10-CM

## 2017-02-05 DIAGNOSIS — O0993 Supervision of high risk pregnancy, unspecified, third trimester: Secondary | ICD-10-CM

## 2017-02-05 DIAGNOSIS — Z23 Encounter for immunization: Secondary | ICD-10-CM | POA: Diagnosis not present

## 2017-02-05 DIAGNOSIS — K429 Umbilical hernia without obstruction or gangrene: Secondary | ICD-10-CM

## 2017-02-05 NOTE — Patient Instructions (Signed)
Return to clinic for any scheduled appointments or obstetric concerns, or go to [facility] for evaluation  

## 2017-02-05 NOTE — Progress Notes (Signed)
?   Hernia in belly area

## 2017-02-05 NOTE — Progress Notes (Addendum)
   PRENATAL VISIT NOTE  Subjective:  Connie Webster is a 36 y.o. G3P1011 at [redacted]w[redacted]d being seen today for ongoing prenatal care.  She is currently monitored for the following issues for this high-risk pregnancy and has Supervision of high risk pregnancy, antepartum, third trimester; AMA (advanced maternal age) multigravida 35+; Asthma affecting pregnancy, antepartum; and Umbilical hernia on her problem list.  Patient reports having umbilical hernia, that is sometimes painful.  Contractions: Not present.  .  Movement: Present. Denies leaking of fluid.   The following portions of the patient's history were reviewed and updated as appropriate: allergies, current medications, past family history, past medical history, past social history, past surgical history and problem list. Problem list updated.  Objective:   Vitals:   02/05/17 0828  BP: 128/85  Pulse: 89  Weight: 149 lb 8 oz (67.8 kg)    Fetal Status: Fetal Heart Rate (bpm): 154 Fundal Height: 28 cm Movement: Present     General:  Alert, oriented and cooperative. Patient is in no acute distress.  Skin: Skin is warm and dry. No rash noted.   Cardiovascular: Normal heart rate noted  Respiratory: Normal respiratory effort, no problems with respiration noted  Abdomen: Soft, gravid, appropriate for gestational age.  Pain/Pressure: Absent   Small umbilical hernia present, no erythema, no tenderness.   Pelvic: Cervical exam deferred        Extremities: Normal range of motion.  Edema: None  Mental Status:  Normal mood and affect. Normal behavior. Normal judgment and thought content.   Assessment and Plan:  Pregnancy: G3P1011 at [redacted]w[redacted]d  1. Umbilical hernia without obstruction and without gangrene Recommended Tylenol prn and close observation; advised to call for any fevers, nausea/vomiting or other concerning symptoms.  2. Elderly multigravida in third trimester 3. Supervision of high risk pregnancy, antepartum, third  trimester Third trimester labs today. - Melaton-Thean-Cham-PassF-LBalm (SLEEP) CAPS; Take by mouth. - Flu Vaccine QUAD 36+ mos IM - Tdap vaccine greater than or equal to 7yo IM - CBC - HIV antibody - RPR - Glucose Tolerance, 2 Hours w/1 Hour Preterm labor symptoms and general obstetric precautions including but not limited to vaginal bleeding, contractions, leaking of fluid and fetal movement were reviewed in detail with the patient. Please refer to After Visit Summary for other counseling recommendations.  Return in about 2 weeks (around 02/19/2017) for OB Visit.   Jaynie Collins, MD

## 2017-02-06 ENCOUNTER — Telehealth: Payer: Self-pay | Admitting: *Deleted

## 2017-02-06 DIAGNOSIS — O24419 Gestational diabetes mellitus in pregnancy, unspecified control: Secondary | ICD-10-CM

## 2017-02-06 LAB — GLUCOSE TOLERANCE, 2 HOURS W/ 1HR
Glucose, 1 hour: 180 mg/dL — ABNORMAL HIGH (ref 65–179)
Glucose, 2 hour: 154 mg/dL — ABNORMAL HIGH (ref 65–152)
Glucose, Fasting: 77 mg/dL (ref 65–91)

## 2017-02-06 LAB — CBC
Hematocrit: 30.8 % — ABNORMAL LOW (ref 34.0–46.6)
Hemoglobin: 10.3 g/dL — ABNORMAL LOW (ref 11.1–15.9)
MCH: 30 pg (ref 26.6–33.0)
MCHC: 33.4 g/dL (ref 31.5–35.7)
MCV: 90 fL (ref 79–97)
Platelets: 273 10*3/uL (ref 150–379)
RBC: 3.43 x10E6/uL — ABNORMAL LOW (ref 3.77–5.28)
RDW: 14.2 % (ref 12.3–15.4)
WBC: 11.3 10*3/uL — ABNORMAL HIGH (ref 3.4–10.8)

## 2017-02-06 LAB — HIV ANTIBODY (ROUTINE TESTING W REFLEX): HIV Screen 4th Generation wRfx: NONREACTIVE

## 2017-02-06 LAB — RPR: RPR Ser Ql: NONREACTIVE

## 2017-02-06 MED ORDER — ACCU-CHEK FASTCLIX LANCETS MISC: 1.0000 [IU] | Freq: Four times a day (QID) | 12 refills | Status: DC

## 2017-02-06 MED ORDER — ACCU-CHEK NANO SMARTVIEW W/DEVICE KIT: 1.0000 | PACK | 0 refills | Status: DC

## 2017-02-06 MED ORDER — GLUCOSE BLOOD VI STRP: ORAL_STRIP | 12 refills | Status: DC

## 2017-02-06 NOTE — Telephone Encounter (Signed)
Called pt to go over GTT results. Explained to pt that GTT was abnormal and she has been diagnosed with GDM. Also went over instructions about picking up supplies from pharmacy, N&D would be calling to set up an appointment, and she could log CBG's in [facility] app. Pt stated that she 'would not be sticking herself' but that I could go ahead and send supplies to pharmacy. She also stated she has had questions in the past during her visits which have not been answered. I advised pt that I would be happy to try to answer any questions she may have and expressed concern that she feels she has not gotten answers before now. Pt stated "I don't have any questions right now. I am just frustrated". Advised pt to call back at any point and I would be glad to help her. Pt expressed understanding.

## 2017-02-19 ENCOUNTER — Encounter: Payer: BLUE CROSS/BLUE SHIELD | Admitting: Family Medicine

## 2017-02-20 ENCOUNTER — Ambulatory Visit (INDEPENDENT_AMBULATORY_CARE_PROVIDER_SITE_OTHER): Payer: BLUE CROSS/BLUE SHIELD | Admitting: Family Medicine

## 2017-02-20 VITALS — BP 129/83 | HR 113 | Wt 150.2 lb

## 2017-02-20 DIAGNOSIS — O24410 Gestational diabetes mellitus in pregnancy, diet controlled: Secondary | ICD-10-CM

## 2017-02-20 DIAGNOSIS — O0993 Supervision of high risk pregnancy, unspecified, third trimester: Secondary | ICD-10-CM

## 2017-02-20 DIAGNOSIS — O99513 Diseases of the respiratory system complicating pregnancy, third trimester: Secondary | ICD-10-CM

## 2017-02-20 DIAGNOSIS — J45909 Unspecified asthma, uncomplicated: Secondary | ICD-10-CM

## 2017-02-20 DIAGNOSIS — O99519 Diseases of the respiratory system complicating pregnancy, unspecified trimester: Secondary | ICD-10-CM

## 2017-02-20 NOTE — Progress Notes (Signed)
   PRENATAL VISIT NOTE  Subjective:  Connie Webster is a 36 y.o. G3P1011 at 8362w2d being seen today for ongoing prenatal care.  She is currently monitored for the following issues for this high-risk pregnancy and has Supervision of high risk pregnancy, antepartum, third trimester; AMA (advanced maternal age) multigravida 35+; Asthma affecting pregnancy, antepartum; Umbilical hernia; and Gestational diabetes mellitus (GDM) on her problem list.  Patient reports abdominal pain.  Reports having one episode of severe abdominal pain that woke her from sleep. She reports the 3 inch area around her belly button was hard and tender. Associated nausea at the time. Resolved with position changes/moving into a fetal position.  Contractions: Not present. Vag. Bleeding: None.  Movement: Present. Denies leaking of fluid.   The following portions of the patient's history were reviewed and updated as appropriate: allergies, current medications, past family history, past medical history, past social history, past surgical history and problem list. Problem list updated.  Objective:   Vitals:   02/20/17 0930  BP: 129/83  Pulse: (!) 113  Weight: 150 lb 3.2 oz (68.1 kg)    Fetal Status: Fetal Heart Rate (bpm): 143 Fundal Height: 30 cm Movement: Present     General:  Alert, oriented and cooperative. Patient is in no acute distress.  Skin: Skin is warm and dry. No rash noted.   Cardiovascular: Normal heart rate noted  Respiratory: Normal respiratory effort, no problems with respiration noted  Abdomen: Soft, gravid, appropriate for gestational age.  Pain/Pressure: Present   Umbilical hernia present, soft and reducible, mildly tender.   Pelvic: Cervical exam deferred        Extremities: Normal range of motion.  Edema: None  Mental Status:  Normal mood and affect. Normal behavior. Normal judgment and thought content.   Assessment and Plan:  Pregnancy: G3P1011 at 7362w2d  1. Asthma affecting pregnancy,  antepartum No sx  2. Diet controlled gestational diabetes mellitus (GDM) in third trimester Refuses to check sugars. Lengthy discussion today-- Patient feels like she should have had follow up after her diagnosis. She was not called by nutrition and reports the "wrong" strips her sent to her pharmacy. We discussed that without testing I cannot determine if her sugar is within range. We discussed the risks of gestational diabetes and the specifically uncontrolled/unmonitored GDM. She voiced understanding.  She discussed her GDM with her mother who is a radiation onc MD who recommended carb and sugar intake. She also reports increasing water intake and exercise. Reviewed importance of FH measurement and possibility of repeat US for EFW.   3. Supervision of high risk pregnancy, antepartum, third trimester - UTD  - To monitor for resolution of UTI, Culture, OB Urine  4. Umbilical hernia - this episode of pain is suspicious for having possible transient bowel involvement. Currently hernia is reducible and soft. Discussed risk of incarceration of bowel and s/sx of incarceration and that if pain reoccurs I recommend immediate evaluation. She voiced understanding  Preterm labor symptoms and general obstetric precautions including but not limited to vaginal bleeding, contractions, leaking of fluid and fetal movement were reviewed in detail with the patient. Please refer to After Visit Summary for other counseling recommendations.  Return in about 2 weeks (around 03/06/2017) for Routine prenatal care.   Federico FlakeKimberly Niles Harlynn Kimbell, MD

## 2017-02-20 NOTE — Progress Notes (Signed)
Not checking blood sugar! Cramping and pain Vaginal Discharge

## 2017-02-22 LAB — CULTURE, OB URINE

## 2017-02-22 LAB — URINE CULTURE, OB REFLEX: Organism ID, Bacteria: NO GROWTH

## 2017-02-25 ENCOUNTER — Telehealth: Payer: Self-pay

## 2017-02-25 ENCOUNTER — Observation Stay
Admission: EM | Admit: 2017-02-25 | Discharge: 2017-02-25 | Disposition: A | Discharge status: Home / Self Care | Payer: BLUE CROSS/BLUE SHIELD | Attending: Certified Nurse Midwife | Admitting: Certified Nurse Midwife

## 2017-02-25 DIAGNOSIS — O368190 Decreased fetal movements, unspecified trimester, not applicable or unspecified: Secondary | ICD-10-CM | POA: Diagnosis present

## 2017-02-25 DIAGNOSIS — R110 Nausea: Secondary | ICD-10-CM

## 2017-02-25 DIAGNOSIS — G43909 Migraine, unspecified, not intractable, without status migrainosus: Secondary | ICD-10-CM | POA: Insufficient documentation

## 2017-02-25 DIAGNOSIS — O26893 Other specified pregnancy related conditions, third trimester: Secondary | ICD-10-CM | POA: Diagnosis not present

## 2017-02-25 DIAGNOSIS — Z3A31 31 weeks gestation of pregnancy: Secondary | ICD-10-CM | POA: Insufficient documentation

## 2017-02-25 DIAGNOSIS — O368130 Decreased fetal movements, third trimester, not applicable or unspecified: Secondary | ICD-10-CM

## 2017-02-25 DIAGNOSIS — R102 Pelvic and perineal pain: Secondary | ICD-10-CM | POA: Insufficient documentation

## 2017-02-25 LAB — GLUCOSE, CAPILLARY: Glucose-Capillary: 63 mg/dL — ABNORMAL LOW (ref 65–99)

## 2017-02-25 MED ORDER — ONDANSETRON 4 MG PO TBDP
8.0000 mg | ORAL_TABLET | Freq: Once | ORAL | Status: AC
  Administered 2017-02-25: 8 mg via ORAL

## 2017-02-25 MED ORDER — ACETAMINOPHEN 500 MG PO TABS
1000.0000 mg | ORAL_TABLET | Freq: Four times a day (QID) | ORAL | Status: DC | PRN
Start: 2017-02-25 — End: 2017-02-25
  Administered 2017-02-25: 1000 mg via ORAL
  Filled 2017-02-25: qty 2

## 2017-02-25 NOTE — Final Progress Note (Addendum)
Physician Final Progress Note  Patient ID: Connie Webster MRN: [id number] DOB/AGE: 36-15-1982 36 y.o.  Admit date: 02/25/2017 Admitting provider: Gae Dry, MD Discharge date: 02/25/2017   Admission Diagnoses: Decreased fetal movement at [redacted] weeks gestation  Discharge Diagnoses:  Reactive NST Migraine headache Nausea Left round ligament pain Consults: None  Significant Findings/ Diagnostic Studies: 36 year old G3 P1011 with EDC=04/29/2017 by a 7wk1d ultrasound presented at 31 weeks with complaints of decreased FM x 2 hours PTA. Earlier in the day had a migraine headache accompanied by blurred vision and nausea and vomiting and was not able to eat x 6 hours PTA. She also reported that she had fallen down 2 steps last week and had seen her prenatal provider the day after. Denies contractions or vaginal bleeding. Has been having some pain in the LLQ. Pain is intermittent and irregular and feels "like a muscle tightening. It is made worse with fetal movement. Has tried using a maternity support belt, but the pressure on her lower abdomen from the support belt is too uncomfortable. She has been prescribed Flexeril, but it makes her too sleepy. No dysuria or foul odor to urine.  [facility] at [facility] has been remarkable for AMA, asthma, and GDM. She has not been checking her blood sugars.  Past Medical History:  Diagnosis Date  . Asthma    Past Surgical History:  Procedure Laterality Date  . DILATION AND CURETTAGE OF UTERUS     OB History  Gravida Para Term Preterm AB Living  3 1 1   1 1   SAB TAB Ectopic Multiple Live Births  1       1    # Outcome Date GA Lbr Len/2nd Weight Sex Delivery Anes PTL Lv  3 Current           2 Term 06/12/07 [redacted]w[redacted]d 3.232 kg (7 lb 2 oz) F Vag-Spont   LIV  1 SAB 2007 933w0d          Social History   Socioeconomic History  . Marital status: Married    Spouse name: Not on file  . Number of children: 1  . Years of education: Not on  file  . Highest education level: Not on file  Social Needs  . Financial resource strain: Not on file  . Food insecurity - worry: Not on file  . Food insecurity - inability: Not on file  . Transportation needs - medical: Not on file  . Transportation needs - non-medical: Not on file  Occupational History  . Not on file  Tobacco Use  . Smoking status: Never Smoker  . Smokeless tobacco: Never Used  Substance and Sexual Activity  . Alcohol use: No  . Drug use: No  . Sexual activity: Yes    Birth control/protection: None  Other Topics Concern  . Not on file  Social History Narrative  . Not on file   Exam: BP (!) 124/57 (BP Location: Left Arm)   Pulse 80   Temp 98 F (36.7 C) (Oral)   Resp 18   Ht 5' 3"  (1.6 m)   Wt 68 kg (150 lb)   LMP 07/07/2016 (Approximate)   BMI 26.57 kg/m   General: gravid, BF in NAD ABD: tenderness in LUS and on border of left side of uterus. Baby palpated on that side Fundus soft and NT FHR: 135 baseline with accelerations to 150s to 160s, moderate variability, no decelerations Toco: some uterine irritability initially. Neuro: alert,  awake, in NAD Psyche: normal mood and normal affect  A: IUP at 31 weeks with reactive NST Nausea and migraine headache Left round ligament pain  P: Patient received Zofran 8 mgm ODT for nausea after which she was able to keep down liquids, crackers and some Tylenol. She was feeling better and requested discharge Discharge home Recommend heating pad and Tylenol for round ligament pain.  Follow up next week as scheduled at [facility].   Procedures: Non stress test done for decreased FM. External monitor applied and there were 15x15 accelerations with a baseline of 135 with moderate variability and no decelerations  Discharge Condition: stable  Disposition: Home  Diet: Encourage fluids and diet as tolerated  Discharge Activity: Activity as tolerated  Discharge Instructions    Discharge patient   Complete by:   As directed    Discharge disposition:  01-Home or Self Care   Discharge patient date:  02/25/2017     Allergies as of 02/25/2017   No Known Allergies     Medication List    TAKE these medications   ACCU-CHEK FASTCLIX LANCETS Misc 1 Units by Percutaneous route 4 (four) times daily.   ACCU-CHEK NANO SMARTVIEW w/Device Kit 1 kit by Subdermal route as directed. Check blood sugars for fasting, and two hours after breakfast, lunch and dinner (4 checks daily)   albuterol 108 (90 Base) MCG/ACT inhaler Commonly known as:  PROVENTIL HFA;VENTOLIN HFA Inhale 2 puffs into the lungs every 4 (four) hours as needed for wheezing or shortness of breath.   famotidine 20 MG tablet Commonly known as:  PEPCID Take 1 tablet (20 mg total) by mouth 2 (two) times daily.   glucose blood test strip Commonly known as:  ACCU-CHEK SMARTVIEW Use as instructed to check blood sugars   SLEEP Caps Take by mouth.   VITAFOL ULTRA 29-0.6-0.4-200 MG Caps Take 1 tablet by mouth daily.      Follow-up [place] for [employer] at [facility]. Go to.   Specialty:  Obstetrics and Gynecology Why:  Keep regularly scheduled appointments Contact information: [place] [phone]          Total time spent taking care of this patient: 20 minutes  Signed: Dalia Heading 02/25/2017, 8:39 PM

## 2017-02-25 NOTE — OB Triage Note (Addendum)
Pt reports that she hasn't felt the baby move in the last couple hours. She also c/o abdominal pain that is intermittent. She did also mentions a fall 10/29, she fell down 2 stairs in the rain and landed on her right side. She had an OB appt the next day, everything seemed to be fine. Denies vaginal bleeding, some discharge present. [facility]

## 2017-03-06 ENCOUNTER — Encounter: Payer: Self-pay | Admitting: Family Medicine

## 2017-03-06 ENCOUNTER — Ambulatory Visit (INDEPENDENT_AMBULATORY_CARE_PROVIDER_SITE_OTHER): Payer: BLUE CROSS/BLUE SHIELD | Admitting: Family Medicine

## 2017-03-06 VITALS — BP 141/85 | HR 92 | Wt 153.6 lb

## 2017-03-06 DIAGNOSIS — O169 Unspecified maternal hypertension, unspecified trimester: Secondary | ICD-10-CM

## 2017-03-06 DIAGNOSIS — O99519 Diseases of the respiratory system complicating pregnancy, unspecified trimester: Secondary | ICD-10-CM

## 2017-03-06 DIAGNOSIS — J45909 Unspecified asthma, uncomplicated: Secondary | ICD-10-CM

## 2017-03-06 DIAGNOSIS — O99513 Diseases of the respiratory system complicating pregnancy, third trimester: Secondary | ICD-10-CM

## 2017-03-06 DIAGNOSIS — O0993 Supervision of high risk pregnancy, unspecified, third trimester: Secondary | ICD-10-CM

## 2017-03-06 DIAGNOSIS — O09523 Supervision of elderly multigravida, third trimester: Secondary | ICD-10-CM

## 2017-03-06 DIAGNOSIS — O24410 Gestational diabetes mellitus in pregnancy, diet controlled: Secondary | ICD-10-CM

## 2017-03-06 HISTORY — DX: Unspecified maternal hypertension, unspecified trimester: O16.9

## 2017-03-06 NOTE — Progress Notes (Addendum)
   PRENATAL VISIT NOTE  Subjective:  Connie Webster is a 36 y.o. G3P1011 at 4121w2d being seen today for ongoing prenatal care.  She is currently monitored for the following issues for this high-risk pregnancy and has Supervision of high risk pregnancy, antepartum, third trimester; AMA (advanced maternal age) multigravida 35+; Asthma affecting pregnancy, antepartum; Umbilical hernia; Gestational diabetes mellitus (GDM); and Elevated blood pressure affecting pregnancy, antepartum on their problem list.  + headaches daily since Monday 11/5. Reports they go away by morning. [facility] and had a reactive NST there.  Contractions: Irregular. Vag. Bleeding: None.  Movement: Present. Denies leaking of fluid.   The following portions of the patient's history were reviewed and updated as appropriate: allergies, current medications, past family history, past medical history, past social history, past surgical history and problem list. Problem list updated.  Objective:   Vitals:   03/06/17 1342 03/06/17 1514  BP: (!) 135/92 (!) 141/85  Pulse: 92   Weight: 153 lb 9.6 oz (69.7 kg)     Fetal Status: Fetal Heart Rate (bpm): 153 Fundal Height: 33 cm Movement: Present     General:  Alert, oriented and cooperative. Patient is in no acute distress.  Skin: Skin is warm and dry. No rash noted.   Cardiovascular: Normal heart rate noted  Respiratory: Normal respiratory effort, no problems with respiration noted  Abdomen: Soft, gravid, appropriate for gestational age.  Pain/Pressure: Present   Umbilical hernia present, soft and reducible, mildly tender.   Pelvic: Cervical exam deferred        Extremities: Normal range of motion.  Edema: Trace  Mental Status:  Normal mood and affect. Normal behavior. Normal judgment and thought content.   Assessment and Plan:  Pregnancy: G3P1011 at 3821w2d   1. Asthma affecting pregnancy, antepartum No sx  2. Diet controlled gestational  diabetes mellitus (GDM) in third trimester Not checking sugars. [facility]. Patient went to a DM education/nutrition class. She still declines to check her blood sugars.   3. Supervision of high risk pregnancy, antepartum, third trimester UTD [facility] FM-- we can usually perform the NST here in clinic.   4. Umbilical hernia -soft a reducible currently  5. Elevated blood pressure.  BP 135/92 and then repeat still elevated. Risk factors for Prex are AMA, GDM. She is having HAs without changes in vision that are alleviated by tylenol and rest but are new.  - Reviewed warning s/sx of Prex - Discussion about plan for for elevated BP with repeat Mon/Tues if still elevated plan for preeclampsia labs - Reviewed if dx with GHTN or PreX then we will start close monitoring and delivery at 37 weeks if stable.    Preterm labor symptoms and general obstetric precautions including but not limited to vaginal bleeding, contractions, leaking of fluid and fetal movement were reviewed in detail with the patient. Please refer to After Visit Summary for other counseling recommendations.   Return in about 2 weeks (around 03/20/2017) for Routine prenatal care.   Federico FlakeKimberly Niles Leonore Frankson, MD

## 2017-03-12 ENCOUNTER — Ambulatory Visit (INDEPENDENT_AMBULATORY_CARE_PROVIDER_SITE_OTHER): Payer: BLUE CROSS/BLUE SHIELD | Admitting: *Deleted

## 2017-03-12 ENCOUNTER — Encounter: Payer: Self-pay | Admitting: Radiology

## 2017-03-12 VITALS — BP 132/85

## 2017-03-12 DIAGNOSIS — O169 Unspecified maternal hypertension, unspecified trimester: Secondary | ICD-10-CM

## 2017-03-12 MED ORDER — PROMETHAZINE HCL 25 MG/ML IJ SOLN
25.0000 mg | Freq: Once | INTRAMUSCULAR | Status: AC
  Administered 2017-03-12: 25 mg via INTRAMUSCULAR

## 2017-03-12 NOTE — Progress Notes (Signed)
Agree with nursing staff's documentation of this patient's clinic encounter.  Connie CollinsUgonna Anyanwu, MD 03/12/2017 11:44 AM

## 2017-03-12 NOTE — Progress Notes (Signed)
Subjective:  Connie Webster is a 36 y.o. female with an elevated BP reading affecting pregnancy.    Current Outpatient Medications  Medication Sig Dispense Refill  . ACCU-CHEK FASTCLIX LANCETS MISC 1 Units by Percutaneous route 4 (four) times daily. 100 each 12  . albuterol (PROVENTIL HFA;VENTOLIN HFA) 108 (90 BASE) MCG/ACT inhaler Inhale 2 puffs into the lungs every 4 (four) hours as needed for wheezing or shortness of breath.    . Blood Glucose Monitoring Suppl (ACCU-CHEK NANO SMARTVIEW) w/Device KIT 1 kit by Subdermal route as directed. Check blood sugars for fasting, and two hours after breakfast, lunch and dinner (4 checks daily) 1 kit 0  . famotidine (PEPCID) 20 MG tablet Take 1 tablet (20 mg total) by mouth 2 (two) times daily. 60 tablet 3  . glucose blood (ACCU-CHEK SMARTVIEW) test strip Use as instructed to check blood sugars 100 each 12  . Melaton-Thean-Cham-PassF-LBalm (SLEEP) CAPS Take by mouth.    . Prenat-Fe Poly-Methfol-FA-DHA (VITAFOL ULTRA) 29-0.6-0.4-200 MG CAPS Take 1 tablet by mouth daily. 30 capsule 12   Current Facility-Administered Medications  Medication Dose Route Frequency Provider Last Rate Last Dose  . promethazine (PHENERGAN) injection 25 mg  25 mg Intramuscular Once Anyanwu, Ugonna A, MD        Hypertension ROS: taking medications as instructed, no medication side effects noted, no TIA's, no chest pain on exertion, no dyspnea on exertion and no swelling of ankles.  New concerns: Headache with nausea and vomiting   Objective:  BP 132/85   LMP 07/07/2016 (Approximate)   Appearance: Nauseated, fatique.  alert, and oriented General exam BP noted to be well controlled today in office.    Assessment:   Hypertension well controlled.   Plan:  Promethazine 63m IM in office today. Current treatment plan is effective, no change in therapy. Follow up in 1 week for ROB and as needed for nausea and vomiting.

## 2017-03-17 ENCOUNTER — Inpatient Hospital Stay (HOSPITAL_COMMUNITY): Admit: 2017-03-17 | Payer: BLUE CROSS/BLUE SHIELD

## 2017-03-17 ENCOUNTER — Ambulatory Visit (HOSPITAL_COMMUNITY): Admit: 2017-03-17 | Payer: BLUE CROSS/BLUE SHIELD

## 2017-03-17 ENCOUNTER — Inpatient Hospital Stay (HOSPITAL_COMMUNITY)
Admission: AD | Admit: 2017-03-17 | Discharge: 2017-03-17 | Discharge status: Against Medical Advice | Payer: BLUE CROSS/BLUE SHIELD | Source: Ambulatory Visit | Attending: Obstetrics and Gynecology | Admitting: Obstetrics and Gynecology

## 2017-03-17 ENCOUNTER — Encounter (HOSPITAL_COMMUNITY): Payer: Self-pay

## 2017-03-17 DIAGNOSIS — O99513 Diseases of the respiratory system complicating pregnancy, third trimester: Secondary | ICD-10-CM | POA: Insufficient documentation

## 2017-03-17 DIAGNOSIS — N858 Other specified noninflammatory disorders of uterus: Secondary | ICD-10-CM

## 2017-03-17 DIAGNOSIS — O26899 Other specified pregnancy related conditions, unspecified trimester: Secondary | ICD-10-CM

## 2017-03-17 DIAGNOSIS — Z5321 Procedure and treatment not carried out due to patient leaving prior to being seen by health care provider: Secondary | ICD-10-CM | POA: Diagnosis not present

## 2017-03-17 DIAGNOSIS — J45909 Unspecified asthma, uncomplicated: Secondary | ICD-10-CM | POA: Insufficient documentation

## 2017-03-17 DIAGNOSIS — O212 Late vomiting of pregnancy: Secondary | ICD-10-CM | POA: Diagnosis not present

## 2017-03-17 DIAGNOSIS — Z820 Family history of epilepsy and other diseases of the nervous system: Secondary | ICD-10-CM | POA: Insufficient documentation

## 2017-03-17 DIAGNOSIS — Z9889 Other specified postprocedural states: Secondary | ICD-10-CM | POA: Diagnosis not present

## 2017-03-17 DIAGNOSIS — Z91010 Allergy to peanuts: Secondary | ICD-10-CM | POA: Insufficient documentation

## 2017-03-17 DIAGNOSIS — Z3A33 33 weeks gestation of pregnancy: Secondary | ICD-10-CM | POA: Diagnosis not present

## 2017-03-17 DIAGNOSIS — K429 Umbilical hernia without obstruction or gangrene: Secondary | ICD-10-CM | POA: Insufficient documentation

## 2017-03-17 DIAGNOSIS — R109 Unspecified abdominal pain: Secondary | ICD-10-CM | POA: Diagnosis not present

## 2017-03-17 DIAGNOSIS — Z79899 Other long term (current) drug therapy: Secondary | ICD-10-CM | POA: Insufficient documentation

## 2017-03-17 DIAGNOSIS — O99113 Other diseases of the blood and blood-forming organs and certain disorders involving the immune mechanism complicating pregnancy, third trimester: Secondary | ICD-10-CM | POA: Insufficient documentation

## 2017-03-17 DIAGNOSIS — N898 Other specified noninflammatory disorders of vagina: Secondary | ICD-10-CM | POA: Diagnosis present

## 2017-03-17 DIAGNOSIS — N859 Noninflammatory disorder of uterus, unspecified: Secondary | ICD-10-CM

## 2017-03-17 DIAGNOSIS — O99613 Diseases of the digestive system complicating pregnancy, third trimester: Secondary | ICD-10-CM | POA: Insufficient documentation

## 2017-03-17 DIAGNOSIS — R112 Nausea with vomiting, unspecified: Secondary | ICD-10-CM

## 2017-03-17 DIAGNOSIS — D72829 Elevated white blood cell count, unspecified: Secondary | ICD-10-CM | POA: Diagnosis not present

## 2017-03-17 DIAGNOSIS — O26893 Other specified pregnancy related conditions, third trimester: Secondary | ICD-10-CM | POA: Diagnosis not present

## 2017-03-17 HISTORY — DX: Umbilical hernia without obstruction or gangrene: K42.9

## 2017-03-17 LAB — CBC WITH DIFFERENTIAL/PLATELET
Basophils Absolute: 0 10*3/uL (ref 0.0–0.1)
Basophils Relative: 0 %
Eosinophils Absolute: 0.3 10*3/uL (ref 0.0–0.7)
Eosinophils Relative: 2 %
HCT: 31.8 % — ABNORMAL LOW (ref 36.0–46.0)
Hemoglobin: 10.2 g/dL — ABNORMAL LOW (ref 12.0–15.0)
Lymphocytes Relative: 27 %
Lymphs Abs: 4.3 10*3/uL — ABNORMAL HIGH (ref 0.7–4.0)
MCH: 29.1 pg (ref 26.0–34.0)
MCHC: 32.1 g/dL (ref 30.0–36.0)
MCV: 90.9 fL (ref 78.0–100.0)
Monocytes Absolute: 0.6 10*3/uL (ref 0.1–1.0)
Monocytes Relative: 4 %
Neutro Abs: 10.5 10*3/uL — ABNORMAL HIGH (ref 1.7–7.7)
Neutrophils Relative %: 67 %
Platelets: 291 10*3/uL (ref 150–400)
RBC: 3.5 MIL/uL — ABNORMAL LOW (ref 3.87–5.11)
RDW: 13.8 % (ref 11.5–15.5)
WBC: 15.7 10*3/uL — ABNORMAL HIGH (ref 4.0–10.5)

## 2017-03-17 LAB — URINALYSIS, ROUTINE W REFLEX MICROSCOPIC
Bilirubin Urine: NEGATIVE
Glucose, UA: 50 mg/dL — AB
Hgb urine dipstick: NEGATIVE
Ketones, ur: NEGATIVE mg/dL
Leukocytes, UA: NEGATIVE
Nitrite: NEGATIVE
Protein, ur: NEGATIVE mg/dL
Specific Gravity, Urine: 1.002 — ABNORMAL LOW (ref 1.005–1.030)
pH: 7 (ref 5.0–8.0)

## 2017-03-17 LAB — PROTEIN / CREATININE RATIO, URINE
Creatinine, Urine: 30 mg/dL
Total Protein, Urine: <6 mg/dL

## 2017-03-17 LAB — COMPREHENSIVE METABOLIC PANEL
ALT: 7 U/L — ABNORMAL LOW (ref 14–54)
AST: 15 U/L (ref 15–41)
Albumin: 2.7 g/dL — ABNORMAL LOW (ref 3.5–5.0)
Alkaline Phosphatase: 152 U/L — ABNORMAL HIGH (ref 38–126)
Anion gap: 7 (ref 5–15)
BUN: <5 mg/dL — ABNORMAL LOW (ref 6–20)
CO2: 24 mmol/L (ref 22–32)
Calcium: 9 mg/dL (ref 8.9–10.3)
Chloride: 106 mmol/L (ref 101–111)
Creatinine, Ser: 0.7 mg/dL (ref 0.44–1.00)
GFR calc Af Amer: >60 mL/min (ref >60)
GFR calc non Af Amer: >60 mL/min (ref >60)
Glucose, Bld: 98 mg/dL (ref 65–99)
Potassium: 4.5 mmol/L (ref 3.5–5.1)
Sodium: 137 mmol/L (ref 135–145)
Total Bilirubin: 0.3 mg/dL (ref 0.3–1.2)
Total Protein: 6.3 g/dL — ABNORMAL LOW (ref 6.5–8.1)

## 2017-03-17 NOTE — MAU Note (Signed)
Around 1300, pt reports going to the bathroom and having a lot of mucous discharge in underwear. Around the same time intense cramping started- comes and goes like ctx. No bleeding, no LOF, +FM

## 2017-03-17 NOTE — MAU Provider Note (Signed)
Chief Complaint:  Abdominal Pain and Vaginal Discharge   provider saw patient at 1625   HPI: Connie Webster is a 36 y.o. G3P1011 at 79w6dho presents to maternity admissions reporting abdominal pain and cramping.  Also had some mucous discharge this morning.  States had cramping of abdomen starting at midnight, but got worse today.  States has had nausea and vomiting for a month.  She reports good fetal movement, denies LOF, vaginal bleeding, vaginal itching/burning, urinary symptoms, h/a, dizziness, n/v, diarrhea, constipation or fever/chills.  Has a known small umbilical hernia  [place].  [facility] states [facility] not [facility].  She tells them symptoms and they tell her "everything is normal".    Abdominal Pain  This is a new problem. The current episode started yesterday. The onset quality is gradual. The problem occurs intermittently. The problem has been unchanged. The pain is located in the generalized abdominal region and periumbilical region. The pain is severe. The quality of the pain is cramping and sharp. The abdominal pain does not radiate. Associated symptoms include nausea and vomiting. Pertinent negatives include no constipation, diarrhea, dysuria, fever or myalgias. The pain is aggravated by palpation. The pain is relieved by nothing. She has tried nothing (Refuses all medication) for the symptoms.  Vaginal Discharge  The patient's primary symptoms include vaginal discharge. Associated symptoms include abdominal pain, nausea and vomiting. Pertinent negatives include no constipation, diarrhea, dysuria or fever.   RN Note: Around 1300, pt reports going to the bathroom and having a lot of mucous discharge in underwear. Around the same time intense cramping started- comes and goes like ctx. No bleeding, no LOF, +FM    Past Medical History: Past Medical History:  Diagnosis Date  .  Asthma   . Umbilical hernia     Past obstetric history: OB History  Gravida Para Term Preterm AB Living  3 1 1   1 1   SAB TAB Ectopic Multiple Live Births  1       1    # Outcome Date GA Lbr Len/2nd Weight Sex Delivery Anes PTL Lv  3 Current           2 Term 06/12/07 457w0d7 lb 2 oz (3.232 kg) F Vag-Spont   LIV  1 SAB 2007 [id number]          Past Surgical History: Past Surgical History:  Procedure Laterality Date  . DILATION AND CURETTAGE OF UTERUS      Family History: Family History  Problem Relation Age of Onset  . Epilepsy Father   . Epilepsy Brother     Social History: Social History   Tobacco Use  . Smoking status: Never Smoker  . Smokeless tobacco: Never Used  Substance Use Topics  . Alcohol use: No  . Drug use: No    Allergies:  Allergies  Allergen Reactions  . Peanut-Containing Drug Products Anaphylaxis    Meds:  Medications Prior to Admission  Medication Sig Dispense Refill Last Dose  . albuterol (PROVENTIL HFA;VENTOLIN HFA) 108 (90 BASE) MCG/ACT inhaler Inhale 2 puffs into the lungs every 4 (four) hours as needed for wheezing or shortness of breath.   Past Week at Unknown time  . ibuprofen (ADVIL,MOTRIN) 200 MG tablet Take 400 mg by mouth every 6 (six) hours as needed for headache.   Past Week at Unknown time  . ACCU-CHEK FASTCLIX  LANCETS MISC 1 Units by Percutaneous route 4 (four) times daily. 100 each 12 Taking  . Blood Glucose Monitoring Suppl (ACCU-CHEK NANO SMARTVIEW) w/Device KIT 1 kit by Subdermal route as directed. Check blood sugars for fasting, and two hours after breakfast, lunch and dinner (4 checks daily) 1 kit 0 Taking  . famotidine (PEPCID) 20 MG tablet Take 1 tablet (20 mg total) by mouth 2 (two) times daily. (Patient not taking: Reported on 03/17/2017) 60 tablet 3 Not Taking at Unknown time  . glucose blood (ACCU-CHEK SMARTVIEW) test strip Use as instructed to check blood sugars 100 each 12 Taking    I have reviewed patient's Past  Medical Hx, Surgical Hx, Family Hx, Social Hx, medications and allergies.   ROS:  Review of Systems  Constitutional: Negative for fever.  Gastrointestinal: Positive for abdominal pain, nausea and vomiting. Negative for constipation and diarrhea.  Genitourinary: Positive for vaginal discharge. Negative for dysuria.  Musculoskeletal: Negative for myalgias.   Other systems negative  Physical Exam   Patient Vitals for the past 24 hrs:  BP Temp Temp src Pulse Resp  03/17/17 1731 138/77 - - 89 -  03/17/17 1718 136/84 - - 96 -  03/17/17 1702 128/80 - - 93 -  03/17/17 1645 (!) 143/95 - - 97 -  03/17/17 1631 134/84 - - 87 -  03/17/17 1616 139/89 - - 92 -  03/17/17 1601 134/80 - - 92 -  03/17/17 1552 (!) 142/76 97.6 F (36.4 C) Oral 89 20   Constitutional: Well-developed, well-nourished female in no acute distress.  Cardiovascular: normal rate and rhythm Respiratory: normal effort, clear to auscultation bilaterally GI: Abd soft,  Diffusely tender, gravid appropriate for gestational age.   No rebound or guarding. Small umbilical hernia.  Small guarding, +/- rebound. MS: Extremities nontender, no edema, normal ROM Neurologic: Alert and oriented x 4.  GU: Neg CVAT.  PELVIC EXAM: Cervix pink, visually closed, without lesion, scant white milky discharge, vaginal walls and external genitalia normal Bimanual exam: Cervix firm, posterior, neg CMT, uterus nontender, Fundal Height consistent with dates, adnexa without tenderness, enlargement, or mass  Dilation: 1 Effacement (%): 40 Station: Ballotable Presentation: Vertex Exam by:: m Blayke Cordrey cnm  FHT:  Baseline 140 , moderate variability, accelerations present, no decelerations Contractions: Uterine irritability   Labs: Results for orders placed or performed during the hospital encounter of 03/17/17 (from the past 24 hour(s))  Urinalysis, Routine w reflex microscopic     Status: Abnormal   Collection Time: 03/17/17  3:40 PM  Result  Value Ref Range   Color, Urine STRAW (A) YELLOW   APPearance CLEAR CLEAR   Specific Gravity, Urine 1.002 (L) 1.005 - 1.030   pH 7.0 5.0 - 8.0   Glucose, UA 50 (A) NEGATIVE mg/dL   Hgb urine dipstick NEGATIVE NEGATIVE   Bilirubin Urine NEGATIVE NEGATIVE   Ketones, ur NEGATIVE NEGATIVE mg/dL   Protein, ur NEGATIVE NEGATIVE mg/dL   Nitrite NEGATIVE NEGATIVE   Leukocytes, UA NEGATIVE NEGATIVE  CBC with Differential/Platelet     Status: Abnormal   Collection Time: 03/17/17  4:24 PM  Result Value Ref Range   WBC 15.7 (H) 4.0 - 10.5 K/uL   RBC 3.50 (L) 3.87 - 5.11 MIL/uL   Hemoglobin 10.2 (L) 12.0 - 15.0 g/dL   HCT 31.8 (L) 36.0 - 46.0 %   MCV 90.9 78.0 - 100.0 fL   MCH 29.1 26.0 - 34.0 pg   MCHC 32.1 30.0 - 36.0 g/dL   RDW 13.8  11.5 - 15.5 %   Platelets 291 150 - 400 K/uL   Neutrophils Relative % 67 %   Neutro Abs 10.5 (H) 1.7 - 7.7 K/uL   Lymphocytes Relative 27 %   Lymphs Abs 4.3 (H) 0.7 - 4.0 K/uL   Monocytes Relative 4 %   Monocytes Absolute 0.6 0.1 - 1.0 K/uL   Eosinophils Relative 2 %   Eosinophils Absolute 0.3 0.0 - 0.7 K/uL   Basophils Relative 0 %   Basophils Absolute 0.0 0.0 - 0.1 K/uL  Comprehensive metabolic panel     Status: Abnormal   Collection Time: 03/17/17  4:24 PM  Result Value Ref Range   Sodium 137 135 - 145 mmol/L   Potassium 4.5 3.5 - 5.1 mmol/L   Chloride 106 101 - 111 mmol/L   CO2 24 22 - 32 mmol/L   Glucose, Bld 98 65 - 99 mg/dL   BUN <5 (L) 6 - 20 mg/dL   Creatinine, Ser 0.70 0.44 - 1.00 mg/dL   Calcium 9.0 8.9 - 10.3 mg/dL   Total Protein 6.3 (L) 6.5 - 8.1 g/dL   Albumin 2.7 (L) 3.5 - 5.0 g/dL   AST 15 15 - 41 U/L   ALT 7 (L) 14 - 54 U/L   Alkaline Phosphatase 152 (H) 38 - 126 U/L   Total Bilirubin 0.3 0.3 - 1.2 mg/dL   GFR calc non Af Amer >60 >60 mL/min   GFR calc Af Amer >60 >60 mL/min   Anion gap 7 5 - 15   O/Positive/-- (05/22 1412)  Imaging:  No results found.  MAU Course/MDM: I have ordered labs and reviewed results.  NST  reviewed and found to be reactive Consult Dr Elly Modena with presentation, exam findings and test results. She came and examined patient. Did not feel hernia was of concern.  With WBC count elevated, she recommends doing an MRI to rule out appendicitis Treatments in MAU included Refused IVF, analgesia, procardia. Does consent to MRI.    When [facility] arrived, pt reported that she cannot and will not remove piercings.  [facility] spoke with MRI tech who said they cannot do scan unless they are removed.  So MRI cancelled    We consulted Surgery (Dr Harlow Asa) who recommended CT which patient refuses.  States her mother is a Pension scheme manager and she "will not be doing it".  He then recommended observation overnight with repeat CBC in am to see if WBC increases.  She refuses "because you are not doing anything for me".  I told her all of the treatments I had offered her (Procardia for preterm contractions/irritability, imaging, pain meds, nausea meds, which she all refused).    States she had an MRI "with all these piercings in" when she had headaches, and doesn't see why they can't do it.  I asked her why she didn't say that when [facility] was here and we were discussing it, and she said "because its in my chart and you should have seen it".   I stated I was busy reviewing other things and consulting with the surgeon so I did not see a history or MRI.  And reiterated she should have mentioned it when we were discussingit.   Husband asked "what causes appendicitis, is it because they have missed everything for the past 6 months?"  I told them Appendicitis usually appears suddenly, pregnant or not, and that is the usual course.  They states "I've never heard of that, I still think she's been having  this all along and you all ignored it".  I reviewed that this was not the case.   I reviewed risks of undiagnosed and untreated appendicitis including harm to her and baby, including death. She restates her wish to sign  out AMA.  States "I am just done with you all".    Offered for her to reconsider MRI, or other treatments and observations, she refuses again. She signed AMA form. I told her if she wanted to return, we would continue to treat her.  Assessment: Single IUP at 65w6dUterine irritability with intermittent contractions Abdominal pain of unclear etiology Leukocytosis Nausea and vomiting for one month  Plan: Patient left AMA Refuses all meds for nausea or pain Encouraged to return here or to other Urgent Care/ED if she develops worsening of symptoms, increase in pain, fever, or other concerning symptoms.   Encouraged to call [facility] in AM to see if they can get her in for care.  [facility]I'll just wait until this baby is coming out and then I'll go to some ER"  MHansel FeinsteinCNM, MSN Certified Nurse-Midwife 03/17/2017 5:46 PM

## 2017-03-17 NOTE — Discharge Instructions (Signed)

## 2017-03-20 ENCOUNTER — Ambulatory Visit (INDEPENDENT_AMBULATORY_CARE_PROVIDER_SITE_OTHER): Payer: BLUE CROSS/BLUE SHIELD | Admitting: Family Medicine

## 2017-03-20 ENCOUNTER — Encounter: Payer: Self-pay | Admitting: Family Medicine

## 2017-03-20 VITALS — BP 126/81 | HR 99 | Wt 155.0 lb

## 2017-03-20 DIAGNOSIS — O09523 Supervision of elderly multigravida, third trimester: Secondary | ICD-10-CM

## 2017-03-20 DIAGNOSIS — J45909 Unspecified asthma, uncomplicated: Secondary | ICD-10-CM

## 2017-03-20 DIAGNOSIS — O99519 Diseases of the respiratory system complicating pregnancy, unspecified trimester: Secondary | ICD-10-CM

## 2017-03-20 DIAGNOSIS — O24410 Gestational diabetes mellitus in pregnancy, diet controlled: Secondary | ICD-10-CM

## 2017-03-20 DIAGNOSIS — O0993 Supervision of high risk pregnancy, unspecified, third trimester: Secondary | ICD-10-CM

## 2017-03-20 DIAGNOSIS — K429 Umbilical hernia without obstruction or gangrene: Secondary | ICD-10-CM

## 2017-03-20 DIAGNOSIS — O169 Unspecified maternal hypertension, unspecified trimester: Secondary | ICD-10-CM

## 2017-03-20 DIAGNOSIS — O479 False labor, unspecified: Secondary | ICD-10-CM

## 2017-03-20 MED ORDER — NIFEDIPINE ER OSMOTIC RELEASE 30 MG PO TB24: 30.0000 mg | ORAL_TABLET | Freq: Every day | ORAL | 2 refills | Status: DC

## 2017-03-20 NOTE — Progress Notes (Signed)
PRENATAL VISIT NOTE  Subjective:  Connie Webster is a 36 y.o. G3P1011 at 1044w2d being seen today for ongoing prenatal care.  Connie Webster is currently monitored for the following issues for this high-risk pregnancy and has Supervision of high risk pregnancy, antepartum, third trimester; AMA (advanced maternal age) multigravida 35+; Asthma affecting pregnancy, antepartum; Umbilical hernia; Gestational diabetes mellitus (GDM); and Elevated blood pressure affecting pregnancy, antepartum on their problem list.   Patient has many complaints today. Connie Webster is "tired and fed up with coming in and saying Connie Webster is hurting and leaving here hurting" and reports "distrust with my care here."    Connie Webster reports contractions every 20-30 minutes and "being in pain all the time" and that no one is listening to her. Connie Webster was frustrated by the MAU provider suggesting Connie Webster needed an MRI or a CT to r/o appendicitis because "Connie Webster knows Connie Webster does not have that because her pain is everywhere not just down here [gesturing to RLQ]". Connie Webster reviewed with the patient that appendicitis can manifest differently in pregnancy and that the diagnostic work up was an attempt to determine the source of her pain.   Patient reports vomiting.  Contractions: Irregular. Vag. Bleeding: None.  Movement: Present. Denies leaking of fluid.   The following portions of the patient's history were reviewed and updated as appropriate: allergies, current medications, past family history, past medical history, past social history, past surgical history and problem list. Problem list updated.  Objective:   Vitals:   03/20/17 1341  BP: 126/81  Pulse: 99  Weight: 155 lb (70.3 kg)    TWG this pregnancy: 15 lb (6.804 kg)   Fetal Status: Fetal Heart Rate (bpm): 135   Movement: Present     General:  Alert, oriented and cooperative. Patient is in no acute distress.  Skin: Skin is warm and dry. No rash noted.   Cardiovascular: Normal heart rate noted    Respiratory: Normal respiratory effort, no problems with respiration noted  Abdomen: Soft, gravid, appropriate for gestational age.  Pain/Pressure: Present     Pelvic: Cervical exam deferred        Extremities: Normal range of motion.  Edema: Trace  Mental Status:  Normal mood and affect. Normal behavior. Normal judgment and thought content.   Assessment and Plan:  Pregnancy: G3P1011 at 4744w2d  1. Supervision of high risk pregnancy, antepartum, third trimester Pregnancy care is up to date, 36 wk labs in 2 weeks Connie Webster have offered to fill out FMLA paperwork as the patient is currently unable to perform essential job functions.   2. Asthma affecting pregnancy, antepartum No sx currently, well controlled.   3. Diet controlled gestational diabetes mellitus (GDM) in third trimester Continues to refuse to check sugars. Received counseling on risk of GDM in pregnancy and voiced understanding of risk.  [facility] and reported it was "all stuff Connie Webster knew already"  4. Elderly multigravida in third trimester  5. Umbilical hernia without obstruction and without gangrene Soft and reducible again today. Reviewed warning signs of incarceration again.   6. Elevated blood pressure affecting pregnancy, antepartum BP is wnl today. Continue to monitor closely  7. Deberah PeltonBraxton Hicks- Patient is having contractions that are not causing cervical change but are frustrating. Connie Webster identifies these as the cause of her pain when Connie Webster was seen in the MAU. Connie Webster was offered procardia at that time but declined. Connie Webster discussed with her today that infrequent/occasional contractions are very normal in the late 3rd  trimester but if Connie Webster is uncomfortable Connie Webster can use procardia. We discussed that this will sometimes stop these preterm contractions but if Connie Webster starts to go into true labor the ctx will not likely be alleviated by this medications. Connie Webster voiced wanting to try this medication and Connie Webster sent in the  prescription. We reviewed preterm labor sx in detail.   8. Patient complaints - Connie Webster used active listening to determine the patient's concerns with her care and reviewed her current care plan  - Patient stated several times "Connie Webster am just not going to come to care and show up to have a baby" - Connie Webster also wanted to know "what her options were to have this baby because Connie Webster am done"-- Connie Webster discussed the risk of preterm delivery and that we do not schedule inductions prior to 39 weeks due to risks to the infant unless medically indicated and Connie Webster discussed the discomfort in pregnancy is not a medical indication. - Connie Webster discussed the importance of prenatal care to diagnose preeclampsia, growth restriction, fetal heart rate anomalies and to prevent stillbirth. Connie Webster stated "you are doing things Connie Webster can do at home." Connie Webster discussed with her the critical nature of prenatal care in the end of pregnancy and my recommendation to receive regular prenatal care by [facility]. Connie Webster [facility]). Connie Webster asked the patient to [facility] for 36 wk labs and Connie Webster can cancel if Connie Webster has an appointment with another practice scheduled.  Connie Webster scheduled an appointment.   Preterm labor symptoms and general obstetric precautions including but not limited to vaginal bleeding, contractions, leaking of fluid and fetal movement were reviewed in detail with the patient. Please refer to After Visit Summary for other counseling recommendations.   Return in about 2 weeks (around 04/03/2017) for Routine prenatal care- Deserai Cansler only per patient request.  Future Appointments  Date Time Provider Department Center  04/03/2017  2:30 PM Federico FlakeNewton, Iyanni Hepp Niles, MD [facility]    Federico FlakeKimberly Niles Tanay Massiah, MD

## 2017-03-20 NOTE — Patient Instructions (Signed)
Come to the MAU (maternity admission unit) for 1) Strong contractions every 2-3 minutes for at least 1 hour that do not go away when you drink water or take a warm shower. These contractions will be so strong all you can do is breath through them 2) Vaginal bleeding- anything more than spotting 3) Loss of fluid like you broke your water 4) Decreased movement of your baby  

## 2017-03-21 NOTE — Progress Notes (Signed)
Pt states she feels as though she has not [facility] and she would like to transfer to Dr. Dalbert GarnetBeasley or 'just stay home until labor'. Pt also states she thinks that since her GTT was 'only off 2 points that she does not have GDM'.  Explained to pt that she really needs to be followed for the remainder of her pregnancy whether [facility] is [facility] or she transfers. She states she would have FOB 'stay home with her until she goes into labor because either way she is not getting any care'. Reviewed chart and explained to pt that after GTT was resulted, she stated she did not intend on checking her CBG's. She sayid "yes because I don't have GDM". Also, for previous UTI, she stated she 'did not take medications because it made her sick'. Explained to pt that she needs to let us know when she has adverse reaction to medication as there are typically alternatives we can offer. Pt states 'there is no need at this point because she is almost at the end of pregnancy. She states she is in pain and "someone at MAU told her she would be induced 04/13/17'. Reviewed chart but did not see that. Explained to pt that MD could review and see if that is within guidelines. She stated she is in too much pain and can't go any longer. She has FMLA papers with her and would like them completed.

## 2017-04-03 ENCOUNTER — Ambulatory Visit (INDEPENDENT_AMBULATORY_CARE_PROVIDER_SITE_OTHER): Payer: BLUE CROSS/BLUE SHIELD | Admitting: Family Medicine

## 2017-04-03 ENCOUNTER — Encounter: Payer: Self-pay | Admitting: Family Medicine

## 2017-04-03 VITALS — BP 132/87 | HR 106 | Wt 157.0 lb

## 2017-04-03 DIAGNOSIS — O09523 Supervision of elderly multigravida, third trimester: Secondary | ICD-10-CM

## 2017-04-03 DIAGNOSIS — J45909 Unspecified asthma, uncomplicated: Secondary | ICD-10-CM

## 2017-04-03 DIAGNOSIS — O0993 Supervision of high risk pregnancy, unspecified, third trimester: Secondary | ICD-10-CM

## 2017-04-03 DIAGNOSIS — O24410 Gestational diabetes mellitus in pregnancy, diet controlled: Secondary | ICD-10-CM

## 2017-04-03 DIAGNOSIS — Z113 Encounter for screening for infections with a predominantly sexual mode of transmission: Secondary | ICD-10-CM

## 2017-04-03 DIAGNOSIS — O99519 Diseases of the respiratory system complicating pregnancy, unspecified trimester: Secondary | ICD-10-CM

## 2017-04-03 NOTE — Progress Notes (Signed)
   PRENATAL VISIT NOTE  Subjective:  Connie Webster is a 36 y.o. G3P1011 at 7875w2d being seen today for ongoing prenatal care.  She is currently monitored for the following issues for this low-risk pregnancy and has Supervision of high risk pregnancy, antepartum, third trimester; AMA (advanced maternal age) multigravida 35+; Asthma affecting pregnancy, antepartum; Umbilical hernia; Gestational diabetes mellitus (GDM); and Elevated blood pressure affecting pregnancy, antepartum on their problem list.  Patient reports no complaints.  Contractions: Irregular. Vag. Bleeding: None.  Movement: Present. Denies leaking of fluid.   The following portions of the patient's history were reviewed and updated as appropriate: allergies, current medications, past family history, past medical history, past social history, past surgical history and problem list. Problem list updated.  Objective:   Vitals:   04/03/17 1450  BP: 132/87  Pulse: (!) 106  Weight: 157 lb (71.2 kg)    Fetal Status: Fetal Heart Rate (bpm): 145   Movement: Present     General:  Alert, oriented and cooperative. Patient is in no acute distress.  Skin: Skin is warm and dry. No rash noted.   Cardiovascular: Normal heart rate noted  Respiratory: Normal respiratory effort, no problems with respiration noted  Abdomen: Soft, gravid, appropriate for gestational age.  Pain/Pressure: Present     Pelvic: Cervical exam deferred        Extremities: Normal range of motion.  Edema: Trace  Mental Status:  Normal mood and affect. Normal behavior. Normal judgment and thought content.   Assessment and Plan:  Pregnancy: G3P1011 at 4875w2d  1. Supervision of high risk pregnancy, antepartum, third trimester - UTD currently - Strep Gp B NAA - Cervicovaginal ancillary only - Extensive discussion regarding timing of delivery. Patient desires an elective induction at 39 weeks. We discussed the risk associated with this at length. She is  "tired of being pregnant and being in pain." We discussed that our recommendation as a practice is 41 weeks. We reviewed increased risk of interventions including CS with elective, non-medical induction. Patient voiced understanding. She requests an IOL 1/2 if possible. I have explained she might be bumped if LD is full or if other medically indicated IOL. Voiced understanding. Will fax request next week per LD protocol  2. Asthma affecting pregnancy, antepartum - No sx today  3. Diet controlled gestational diabetes mellitus (GDM) in third trimester -Not checking blood glucose levels  4. Elderly multigravida in third trimester   Preterm labor symptoms and general obstetric precautions including but not limited to vaginal bleeding, contractions, leaking of fluid and fetal movement were reviewed in detail with the patient. Please refer to After Visit Summary for other counseling recommendations.  Return in about 1 week (around 04/10/2017) for Routine prenatal care.  Future Appointments  Date Time Provider Department Center  04/10/2017  1:45 PM Rennie Plowmaneill, Caroline M, CNM [facility]  04/17/2017  9:45 AM Trimble BingPickens, Charlie, MD [facility]    Federico FlakeKimberly Niles Shandreka Dante, MD

## 2017-04-04 LAB — CERVICOVAGINAL ANCILLARY ONLY
Bacterial vaginitis: NEGATIVE
Candida vaginitis: NEGATIVE
Chlamydia: NEGATIVE
Neisseria Gonorrhea: NEGATIVE
Trichomonas: NEGATIVE

## 2017-04-05 LAB — STREP GP B NAA: Strep Gp B NAA: NEGATIVE

## 2017-04-10 ENCOUNTER — Ambulatory Visit (INDEPENDENT_AMBULATORY_CARE_PROVIDER_SITE_OTHER): Payer: BLUE CROSS/BLUE SHIELD

## 2017-04-10 VITALS — BP 129/74 | HR 100 | Wt 157.2 lb

## 2017-04-10 DIAGNOSIS — O169 Unspecified maternal hypertension, unspecified trimester: Secondary | ICD-10-CM

## 2017-04-10 DIAGNOSIS — O99519 Diseases of the respiratory system complicating pregnancy, unspecified trimester: Secondary | ICD-10-CM

## 2017-04-10 DIAGNOSIS — O479 False labor, unspecified: Secondary | ICD-10-CM

## 2017-04-10 DIAGNOSIS — O24410 Gestational diabetes mellitus in pregnancy, diet controlled: Secondary | ICD-10-CM

## 2017-04-10 DIAGNOSIS — O0993 Supervision of high risk pregnancy, unspecified, third trimester: Secondary | ICD-10-CM

## 2017-04-10 DIAGNOSIS — J45909 Unspecified asthma, uncomplicated: Secondary | ICD-10-CM

## 2017-04-10 NOTE — Progress Notes (Signed)
   PRENATAL VISIT NOTE  Subjective:  Connie Webster is a 36 y.o. G3P1011 at 7062w2d being seen today for ongoing prenatal care.  She is currently monitored for the following issues for this high-risk pregnancy and has Supervision of high risk pregnancy, antepartum, third trimester; AMA (advanced maternal age) multigravida 35+; Asthma affecting pregnancy, antepartum; Umbilical hernia; Gestational diabetes mellitus (GDM); and Elevated blood pressure affecting pregnancy, antepartum on their problem list.  Patient reports occasional contractions.  Contractions: Irregular.  .  Movement: Present. Denies leaking of fluid.   The following portions of the patient's history were reviewed and updated as appropriate: allergies, current medications, past family history, past medical history, past social history, past surgical history and problem list. Problem list updated.  Objective:   Vitals:   04/10/17 1351  BP: 129/74  Pulse: 100  Weight: 157 lb 3.2 oz (71.3 kg)    Fetal Status: Fetal Heart Rate (bpm): 134   Movement: Present     General:  Alert, oriented and cooperative. Patient is in no acute distress.  Skin: Skin is warm and dry. No rash noted.   Cardiovascular: Normal heart rate noted  Respiratory: Normal respiratory effort, no problems with respiration noted  Abdomen: Soft, gravid, appropriate for gestational age.  Pain/Pressure: Present     Pelvic: Cervical exam deferred        Extremities: Normal range of motion.     Mental Status:  Normal mood and affect. Normal behavior. Normal judgment and thought content.   Fetal Tracing:  Baseline: 150 Variability: moderate Accels: 15x15 Decels: none  Toco: occasional uc's  Assessment and Plan:  Pregnancy: G3P1011 at 2562w2d  1. Supervision of high risk pregnancy, antepartum, third trimester -Elective IOL scheduled for 1/2 at 0630am -NST reactive  2. Diet controlled gestational diabetes mellitus (GDM) in third  trimester -Patient not checking CBGs -Lengthy discussion about importance of testing blood sugars, bringing log book and keeping appointments. Discussed risks of uncontrolled DM in pregnancy including delayed fetal lung maturity, IUFD and shoulder dystocia possibly resulting brachial plexus palsy, brain damage, intrapartum death and extensive obstetric lacerations. Patient verbalizes understanding.  3. Asthma affecting pregnancy, antepartum -Currently well controlled  4. Elevated blood pressure affecting pregnancy, antepartum -No elevated BP today  5. Braxton Hick's contraction -Comfort measures reviewed   Term labor symptoms and general obstetric precautions including but not limited to vaginal bleeding, contractions, leaking of fluid and fetal movement were reviewed in detail with the patient. Please refer to After Visit Summary for other counseling recommendations.  Return in about 1 week (around 04/17/2017) for Return OB visit.  Rolm BookbinderCaroline M Hila Bolding, CNM  04/10/17 2:36 PM

## 2017-04-12 ENCOUNTER — Telehealth (HOSPITAL_COMMUNITY): Payer: Self-pay | Admitting: *Deleted

## 2017-04-12 ENCOUNTER — Encounter (HOSPITAL_COMMUNITY): Payer: Self-pay | Admitting: *Deleted

## 2017-04-12 NOTE — Telephone Encounter (Signed)
Preadmission screen  

## 2017-04-17 ENCOUNTER — Other Ambulatory Visit: Payer: Self-pay

## 2017-04-17 ENCOUNTER — Ambulatory Visit (INDEPENDENT_AMBULATORY_CARE_PROVIDER_SITE_OTHER): Payer: BLUE CROSS/BLUE SHIELD | Admitting: Obstetrics and Gynecology

## 2017-04-17 ENCOUNTER — Encounter (HOSPITAL_COMMUNITY): Payer: Self-pay

## 2017-04-17 ENCOUNTER — Inpatient Hospital Stay (HOSPITAL_COMMUNITY)
Admission: AD | Admit: 2017-04-17 | Discharge: 2017-04-19 | DRG: 807 | Disposition: A | Discharge status: Home / Self Care | Payer: BLUE CROSS/BLUE SHIELD | Source: Ambulatory Visit | Attending: Obstetrics & Gynecology | Admitting: Obstetrics & Gynecology

## 2017-04-17 VITALS — BP 142/94 | HR 108 | Wt 156.4 lb

## 2017-04-17 DIAGNOSIS — O169 Unspecified maternal hypertension, unspecified trimester: Secondary | ICD-10-CM

## 2017-04-17 DIAGNOSIS — O99519 Diseases of the respiratory system complicating pregnancy, unspecified trimester: Secondary | ICD-10-CM

## 2017-04-17 DIAGNOSIS — L282 Other prurigo: Secondary | ICD-10-CM

## 2017-04-17 DIAGNOSIS — O9952 Diseases of the respiratory system complicating childbirth: Secondary | ICD-10-CM | POA: Diagnosis present

## 2017-04-17 DIAGNOSIS — F329 Major depressive disorder, single episode, unspecified: Secondary | ICD-10-CM | POA: Diagnosis present

## 2017-04-17 DIAGNOSIS — J45909 Unspecified asthma, uncomplicated: Secondary | ICD-10-CM | POA: Diagnosis present

## 2017-04-17 DIAGNOSIS — O0993 Supervision of high risk pregnancy, unspecified, third trimester: Secondary | ICD-10-CM

## 2017-04-17 DIAGNOSIS — Z3A38 38 weeks gestation of pregnancy: Secondary | ICD-10-CM

## 2017-04-17 DIAGNOSIS — O134 Gestational [pregnancy-induced] hypertension without significant proteinuria, complicating childbirth: Secondary | ICD-10-CM | POA: Diagnosis not present

## 2017-04-17 DIAGNOSIS — O09529 Supervision of elderly multigravida, unspecified trimester: Secondary | ICD-10-CM

## 2017-04-17 DIAGNOSIS — O99513 Diseases of the respiratory system complicating pregnancy, third trimester: Secondary | ICD-10-CM

## 2017-04-17 DIAGNOSIS — O99713 Diseases of the skin and subcutaneous tissue complicating pregnancy, third trimester: Secondary | ICD-10-CM

## 2017-04-17 DIAGNOSIS — O133 Gestational [pregnancy-induced] hypertension without significant proteinuria, third trimester: Secondary | ICD-10-CM | POA: Diagnosis present

## 2017-04-17 DIAGNOSIS — Z9119 Patient's noncompliance with other medical treatment and regimen: Secondary | ICD-10-CM

## 2017-04-17 DIAGNOSIS — O24419 Gestational diabetes mellitus in pregnancy, unspecified control: Secondary | ICD-10-CM

## 2017-04-17 DIAGNOSIS — F419 Anxiety disorder, unspecified: Secondary | ICD-10-CM | POA: Diagnosis present

## 2017-04-17 DIAGNOSIS — Z3490 Encounter for supervision of normal pregnancy, unspecified, unspecified trimester: Secondary | ICD-10-CM | POA: Diagnosis present

## 2017-04-17 DIAGNOSIS — O99344 Other mental disorders complicating childbirth: Secondary | ICD-10-CM | POA: Diagnosis present

## 2017-04-17 DIAGNOSIS — O09523 Supervision of elderly multigravida, third trimester: Secondary | ICD-10-CM

## 2017-04-17 DIAGNOSIS — Z91199 Patient's noncompliance with other medical treatment and regimen due to unspecified reason: Secondary | ICD-10-CM

## 2017-04-17 HISTORY — DX: Essential (primary) hypertension: I10

## 2017-04-17 HISTORY — DX: Anxiety disorder, unspecified: F41.9

## 2017-04-17 HISTORY — DX: Depression, unspecified: F32.A

## 2017-04-17 HISTORY — DX: Major depressive disorder, single episode, unspecified: F32.9

## 2017-04-17 LAB — CBC
HCT: 34 % — ABNORMAL LOW (ref 36.0–46.0)
Hemoglobin: 10.9 g/dL — ABNORMAL LOW (ref 12.0–15.0)
MCH: 27.5 pg (ref 26.0–34.0)
MCHC: 32.1 g/dL (ref 30.0–36.0)
MCV: 85.6 fL (ref 78.0–100.0)
Platelets: 312 10*3/uL (ref 150–400)
RBC: 3.97 MIL/uL (ref 3.87–5.11)
RDW: 14.8 % (ref 11.5–15.5)
WBC: 13.1 10*3/uL — ABNORMAL HIGH (ref 4.0–10.5)

## 2017-04-17 LAB — COMPREHENSIVE METABOLIC PANEL
ALT: 9 U/L — ABNORMAL LOW (ref 14–54)
AST: 23 U/L (ref 15–41)
Albumin: 3 g/dL — ABNORMAL LOW (ref 3.5–5.0)
Alkaline Phosphatase: 230 U/L — ABNORMAL HIGH (ref 38–126)
Anion gap: 12 (ref 5–15)
BUN: 6 mg/dL (ref 6–20)
CO2: 20 mmol/L — ABNORMAL LOW (ref 22–32)
Calcium: 9.5 mg/dL (ref 8.9–10.3)
Chloride: 104 mmol/L (ref 101–111)
Creatinine, Ser: 0.7 mg/dL (ref 0.44–1.00)
GFR calc Af Amer: >60 mL/min (ref >60)
GFR calc non Af Amer: >60 mL/min (ref >60)
Glucose, Bld: 91 mg/dL (ref 65–99)
Potassium: 4.5 mmol/L (ref 3.5–5.1)
Sodium: 136 mmol/L (ref 135–145)
Total Bilirubin: 0.6 mg/dL (ref 0.3–1.2)
Total Protein: 6.8 g/dL (ref 6.5–8.1)

## 2017-04-17 LAB — GLUCOSE, CAPILLARY
Glucose-Capillary: 67 mg/dL (ref 65–99)
Glucose-Capillary: 123 mg/dL — ABNORMAL HIGH (ref 65–99)

## 2017-04-17 LAB — TYPE AND SCREEN
ABO/RH(D): O POS
Antibody Screen: NEGATIVE

## 2017-04-17 LAB — PROTEIN / CREATININE RATIO, URINE
Creatinine, Urine: 387 mg/dL
Protein Creatinine Ratio: 0.17 mg/mg{Cre} — ABNORMAL HIGH (ref 0.00–0.15)
Total Protein, Urine: 65 mg/dL

## 2017-04-17 LAB — ABO/RH: ABO/RH(D): O POS

## 2017-04-17 MED ORDER — OXYCODONE-ACETAMINOPHEN 5-325 MG PO TABS: 2.0000 | ORAL_TABLET | ORAL | Status: DC | PRN

## 2017-04-17 MED ORDER — OXYTOCIN 40 UNITS IN LACTATED RINGERS INFUSION - SIMPLE MED
1.0000 m[IU]/min | INTRAVENOUS | Status: DC
  Administered 2017-04-17: 2 m[IU]/min via INTRAVENOUS
  Filled 2017-04-17: qty 1000

## 2017-04-17 MED ORDER — OXYTOCIN 40 UNITS IN LACTATED RINGERS INFUSION - SIMPLE MED: 2.5000 [IU]/h | INTRAVENOUS | Status: DC

## 2017-04-17 MED ORDER — OXYCODONE-ACETAMINOPHEN 5-325 MG PO TABS: 1.0000 | ORAL_TABLET | ORAL | Status: DC | PRN

## 2017-04-17 MED ORDER — LACTATED RINGERS IV SOLN: 500.0000 mL | INTRAVENOUS | Status: DC | PRN

## 2017-04-17 MED ORDER — LACTATED RINGERS IV SOLN
INTRAVENOUS | Status: DC
  Administered 2017-04-17: 19:00:00 via INTRAVENOUS

## 2017-04-17 MED ORDER — FLEET ENEMA 7-19 GM/118ML RE ENEM: 1.0000 | ENEMA | RECTAL | Status: DC | PRN

## 2017-04-17 MED ORDER — OXYTOCIN BOLUS FROM INFUSION
500.0000 mL | Freq: Once | INTRAVENOUS | Status: AC
  Administered 2017-04-18: 500 mL via INTRAVENOUS

## 2017-04-17 MED ORDER — ONDANSETRON HCL 4 MG/2ML IJ SOLN
4.0000 mg | Freq: Four times a day (QID) | INTRAMUSCULAR | Status: DC | PRN
Start: 2017-04-17 — End: 2017-04-18

## 2017-04-17 MED ORDER — ONDANSETRON HCL 4 MG/2ML IJ SOLN: 4.0000 mg | Freq: Four times a day (QID) | INTRAMUSCULAR | Status: DC | PRN

## 2017-04-17 MED ORDER — BUTORPHANOL TARTRATE 1 MG/ML IJ SOLN
INTRAMUSCULAR | Status: AC
  Administered 2017-04-17: 1 mg via INTRAVENOUS
  Filled 2017-04-17: qty 1

## 2017-04-17 MED ORDER — SOD CITRATE-CITRIC ACID 500-334 MG/5ML PO SOLN: 30.0000 mL | ORAL | Status: DC | PRN

## 2017-04-17 MED ORDER — MISOPROSTOL 50MCG HALF TABLET
50.0000 ug | ORAL_TABLET | Freq: Once | ORAL | Status: AC
  Administered 2017-04-17: 50 ug via ORAL
  Filled 2017-04-17: qty 1

## 2017-04-17 MED ORDER — OXYTOCIN BOLUS FROM INFUSION: 500.0000 mL | Freq: Once | INTRAVENOUS | Status: DC

## 2017-04-17 MED ORDER — LIDOCAINE HCL (PF) 1 % IJ SOLN
30.0000 mL | INTRAMUSCULAR | Status: DC | PRN
  Filled 2017-04-17: qty 30

## 2017-04-17 MED ORDER — TERBUTALINE SULFATE 1 MG/ML IJ SOLN: 0.2500 mg | Freq: Once | INTRAMUSCULAR | Status: DC | PRN

## 2017-04-17 MED ORDER — ACETAMINOPHEN 325 MG PO TABS: 650.0000 mg | ORAL_TABLET | ORAL | Status: DC | PRN

## 2017-04-17 MED ORDER — BUTORPHANOL TARTRATE 1 MG/ML IJ SOLN
1.0000 mg | INTRAMUSCULAR | Status: DC | PRN
  Administered 2017-04-17: 1 mg via INTRAVENOUS

## 2017-04-17 MED ORDER — LIDOCAINE HCL (PF) 1 % IJ SOLN: 30.0000 mL | INTRAMUSCULAR | Status: DC | PRN

## 2017-04-17 MED ORDER — LACTATED RINGERS IV SOLN
INTRAVENOUS | Status: DC
  Administered 2017-04-17: 12:00:00 via INTRAVENOUS

## 2017-04-17 NOTE — Progress Notes (Signed)
Pt complains of itching in her legs and arms.

## 2017-04-17 NOTE — MAU Note (Signed)
Pt sent from MD office with elevated BP, has HA intermittently, "when I get upset."  Denies visual changes.  C/O itching.  Has irregular contractions, denies bleeding or LOF.

## 2017-04-17 NOTE — Progress Notes (Signed)
Labor Progress Note  Connie Webster is a 36 y.o. G3P1011 at 487w2d  admitted for induction of labor due to gHTN.  S: Uncomfortable with contractions. Not wanting anything for pain at this time.   O:  BP 139/84   Pulse 91   Temp 98 F (36.7 C) (Oral)   Resp 18   LMP 07/07/2016 (Approximate)   No intake/output data recorded.  FHT:  FHR: 130 bpm, variability: moderate,  accelerations:  Present,  decelerations:  Absent UC:   regular, every 2-4 minutes SVE:   Dilation: 1 Effacement (%): Thick Station: -2 Exam by:: Thressa ShellerHeather Hogan, CNM Membranes intact  Pitocin @ 8 mu/min  Labs: Lab Results  Component Value Date   WBC 13.1 (H) 04/17/2017   HGB 10.9 (L) 04/17/2017   HCT 34.0 (L) 04/17/2017   MCV 85.6 04/17/2017   PLT 312 04/17/2017    Assessment / Plan: 36 y.o. G3P1011 327w2d. Not in labor Induction of labor due to gestational hypertension  Labor: Not in labor. Refused cytotec originally. On pitocin currently. Failed FB attempt by previous provider. Patient declines another attempt. Discussed augmentation methods. Patient willing to do cytotec. D/c pitocin and start cytotec for cervical ripening.  GHTN: BPs stable. No severe range pressures or PIH symtpoms. GDM: CBGs normal.  Fetal Wellbeing:  Category I Pain Control:  Labor support without medications Anticipated MOD:  NSVD  Expectant management   Caryl AdaJazma Phelps, DO OB Fellow

## 2017-04-17 NOTE — Progress Notes (Signed)
Prenatal Visit Note Date: 04/17/2017 Clinic: [facility]  Subjective:  Connie Webster is a 36 y.o. G3P1011 at 1060w2d being seen today for ongoing prenatal care.  She is currently monitored for the following issues for this high-risk pregnancy and has Supervision of high risk pregnancy, antepartum, third trimester; AMA (advanced maternal age) multigravida 35+; Asthma affecting pregnancy, antepartum; Umbilical hernia; Gestational diabetes mellitus (GDM); and Elevated blood pressure affecting pregnancy, antepartum on their problem list.  Patient reports diffuse itching. has HA but has h/o pre-exisiting HA before pregnancy. no visual s/s.   Contractions: Irregular. Vag. Bleeding: None.  Movement: Present. Denies leaking of fluid.   The following portions of the patient's history were reviewed and updated as appropriate: allergies, current medications, past family history, past medical history, past social history, past surgical history and problem list. Problem list updated.  Objective:   Vitals:   04/17/17 0952 04/17/17 1009  BP: (!) 144/87 (!) 142/94  Pulse: (!) 108   Weight: 156 lb 6.4 oz (70.9 kg)     Fetal Status: Fetal Heart Rate (bpm): 135 Fundal Height: 37 cm Movement: Present  Presentation: Vertex  General:  Alert, oriented and cooperative. Patient is in no acute distress.  Skin: Skin is warm and dry. No rash noted.   Cardiovascular: Normal heart rate noted  Respiratory: Normal respiratory effort, no problems with respiration noted  Abdomen: Soft, gravid, appropriate for gestational age. Pain/Pressure: Present     Pelvic:  Cervical exam deferred        Extremities: Normal range of motion.  Edema: None  Mental Status: Normal mood and affect. Normal behavior. Normal judgment and thought content.  Normal s1 and s2, no MRGs CTAB 2+ brachial  Urinalysis:      Assessment and Plan:  Pregnancy: G3P1011 at 7660w2d  1. Asthma affecting pregnancy,  antepartum Prn albuterol  2. Supervision of high risk pregnancy, antepartum, third trimester GBS neg  3. Elderly multigravida in third trimester  4. Prurigo of pregnancy, third trimester See below.   5. Elevated blood pressure affecting pregnancy, antepartum D/w her re: elevated BP and [facility] pre-ecl[facility] evaluation. I told her that based on her history, she most likely has gHTN and will be kep to be induced, but if sent home pt scheduled for nst/afi/rob for next week. Pt is non compliant with checking BS values and is reluctant [facility]. Risk of IUFD, maternal stroke, etc d/w her and importance of going for evaluation. Recommend drawing bile acids while there to, given diffuse itching. No recent growth scans available and AP testing not done today in the office.   6. Gestational diabetes mellitus (GDM) in third trimester, gestational diabetes method of control unspecified See above  7. Poor compliance See above.   Term labor symptoms and general obstetric precautions including but not limited to vaginal bleeding, contractions, leaking of fluid and fetal movement were reviewed in detail with the patient. Please refer to After Visit Summary for other counseling recommendations.  Return in about 5 days (around 04/22/2017) for rob, nst/afi.   [place] BingPickens, Escarlet Saathoff, MD

## 2017-04-17 NOTE — Anesthesia Pain Management Evaluation Note (Signed)
  CRNA Pain Management Visit Note  Patient: Connie Webster, 36 y.o., female  "Hello I am a member of the anesthesia team at [facility]. We have an anesthesia team available at all times to provide care throughout the hospital, including epidural management and anesthesia for C-section. I don't know your plan for the delivery whether it a natural birth, water birth, IV sedation, nitrous supplementation, doula or epidural, but we want to meet your pain goals."   1.Was your pain managed to your expectations on prior hospitalizations?   Yes   2.What is your expectation for pain management during this hospitalization?     Epidural  3.How can we help you reach that goal? epidural  Record the patient's initial score and the patient's pain goal.   Pain: 6  Pain Goal: 9 [facility] wants you to be able to say your pain was always managed very well.  Connie Webster 04/17/2017

## 2017-04-17 NOTE — H&P (Signed)
Connie Webster is a 36 y.o. female presenting for IOL 2/2 GHTN. Patient had elevated blood pressure at OB visit on 11/1/4, normal labs. The again today blood pressure was elevated at her visit. Labs normal at this time. Denies any SX. She is here today tearful, and upset. She reports that she has recently had an argument with her partner. She is tearful and anxious.  OB History    Gravida Para Term Preterm AB Living   3 1 1   1 1    SAB TAB Ectopic Multiple Live Births   1       1     Past Medical History:  Diagnosis Date  . Anxiety   . Asthma    frequently uses inhaler  . Depression    takes meds  . Gestational diabetes    diet controlled  . Hypertension    gestational   . Umbilical hernia    Past Surgical History:  Procedure Laterality Date  . DILATION AND CURETTAGE OF UTERUS     Family History: family history includes Epilepsy in her brother and father. Social History:  reports that  has never smoked. she has never used smokeless tobacco. She reports that she does not drink alcohol or use drugs.     Maternal Diabetes: Yes:  Diabetes Type:  Diet controlled Genetic Screening: Normal Maternal Ultrasounds/Referrals: Normal Fetal Ultrasounds or other Referrals:  None Maternal Substance Abuse:  No Significant Maternal Medications:  None Significant Maternal Lab Results:  None Other Comments:  limited compliance with GDM, not checking blood sugars at home   Review of Systems  Eyes: Negative for blurred vision.  Gastrointestinal: Negative for abdominal pain.  Neurological: Positive for headaches (patient reports that she has been crying all day, and feels headache is from that. ).  All other systems reviewed and are negative.  Maternal Medical History:  Reason for admission: Gestational hypertension   Contractions: Frequency: regular.   Perceived severity is mild.    Fetal activity: Perceived fetal activity is normal.   Last perceived fetal movement was  within the past hour.    Prenatal Complications - Diabetes: gestational. Diabetes is managed by diet.        Blood pressure (!) 148/74, pulse 100, temperature 98.2 F (36.8 C), temperature source Oral, resp. rate 16, last menstrual period 07/07/2016. Maternal Exam:  Uterine Assessment: Contraction strength is mild.  Contraction frequency is rare and regular.   Abdomen: Patient reports no abdominal tenderness. Fetal presentation: vertex  Introitus: Normal vulva. Normal vagina.  Pelvis: adequate for delivery.      Fetal Exam Fetal Monitor Review: Mode: ultrasound.   Baseline rate: 145.  Variability: moderate (6-25 bpm).   Pattern: accelerations present and no decelerations.    Fetal State Assessment: Category I - tracings are normal.     Physical Exam  Nursing note and vitals reviewed. Constitutional: She is oriented to person, place, and time. She appears well-developed and well-nourished. No distress.  HENT:  Head: Normocephalic.  Cardiovascular: Normal rate.  Respiratory: Effort normal.  GI: Soft. There is no rebound.  Genitourinary:  Genitourinary Comments: Cervix: 1 at ext os/closed at internal os/thick/-2/midposition   Neurological: She is alert and oriented to person, place, and time.  Skin: Skin is warm and dry.  Psychiatric: She has a normal mood and affect.    Prenatal labs: ABO, Rh: --/--/O POS (12/26 1227) Antibody: NEG (12/26 1227) Rubella: 1.87 (05/22 1412) RPR: Non Reactive (10/16 0815)  HBsAg: Negative (05/22 1412)  HIV:   non-reactive  GBS: Negative (12/12 1530)   Assessment/Plan: G3P1011 at 6152w2d with GHTN Patient is asymptomatic at this time. Will continue to monitor sx, and consider mag or antihypertensives PRN  Anxiety/depression, not well controlled: Chaplin and SW consults Admit to labor and delivery, patient does not wish to have cytotec. Will proceed with pitocin, unable to place foley at this time due to internal os being closed.  Patient is agreeable with plan for pitocin and then foley bulb later once more dilated. She does not wish to have cytotec at this time.  Anticipate NSVD Monitor moods Thressa ShellerHeather Damiano Stamper 04/17/2017, 1:55 PM

## 2017-04-17 NOTE — Progress Notes (Signed)
I was paged by pt's RN from [facility]U due to pt's hopelessness and conflict with her husband.  Pt was crying uncontrollably and having a difficult time and her husband was having a difficult time coping.    I spent time with them each individually.  Connie Webster (pt's preferred name) has struggled with depression, anxiety and PTSD since the age of 36.  At that time she coped by cutting and other self-mutilation.  She was prescribed medication which she stayed on for some period of time, but found that it made her groggy.  Under the supervision of a physician, she stopped the medication and managed her symptoms in behavioral adaptations (regular exercise--swimming, avoiding large crowds, finding ways to calm herself down in the present moment such as breathing and counting).  She found that these methods made her depression and anxiety manageable and she has not engaged in cutting or other self-harm since the age of 36.      About a month ago, her depression became very intense, but because she was pregnant, she did not want to take any medication at that time.    She has a 10917 year old daughter who is currently visiting her biological father in [place] where Connie Webster is also from.  Connie Webster's mother will fly back to [place] with her daughter on January 3 and stay for several weeks to help with the baby.  Connie Webster has been in [place] [employer] for 6 years and has several very good friends who "get her" and know when she is not okay and help her make adjustments if they are with her.  They are all out of town right now for the holidays.    Connie Webster and Connie Webster have been married for a little over a year, but have known each other for 2 years.  The relationship has been rocky for several months.  Connie Webster feels safe in the home, but she does not feel that it is a healthy emotional or psychological environment for her because of her lack of support from [place].  She is really uncertain about the future and she finds the uncertainty  incredibly stressful as she anticipates a new baby.  She feels that her stress level has directly contributed to her high blood pressure.    Today, Connie Webster talked about hopelessness and Connie Webster took that to mean that she is suicidal.  Connie Webster denies any intent to harm herself or others and has no plan to do so.  She does not even describe it as suicidal ideation, but more a hopelessness and a wishing that she were not in this situation.  She is motivated by her children, however, to keep fighting.  She is exhausted on a physical, mental and emotional level and feels that Connie Webster does not show her respect and continues to "blame the victim" because she cannot help that she is depressed.    She stated that she has a Veterinary surgeoncounselor that she has been seeing for years [facility] [place], but Connie Webster is unaware of this because she thinks he would not approve.  Her next appointment is Feb 4.  I encouraged her to call and let them know her current situation.  I also encouraged her to talk with her provider about medication for her depression before she leaves the hospital.  She agreed to all of this and felt that it was a good plan.  She does not, however, want to talk with social work or "anyone else" because she finds it difficult  to trust people.  She stated, "That will just not go well.   I don't do well if I feel like they are forcing me to talk to someone."  I encouraged her to actively participate if there was someone the medical team wished for her to speak with and reminded her that they are there to help and support her.  I spoke with Connie Webster separately.  He cares about her, but feels helpless.  He thinks that she has been prescribed medication for depression, but that she is refusing to take it.  I consulted with pt's nurse and we saw no medication listed in her chart for depression.  Connie Webster agreed to seek counseling to help him cope with the stress of the uncertainty of their relationship and the  helplessness he feels with her depression.  I provided him with counseling referrals and some information about how to support a loved one who is going through depression.    I encouraged each of them to take one moment at a time and to focus on the delivery of their baby for the time being.  Connie Webster is aware of ongoing availability for spiritual care services, and knows she can ask staff if she would like to talk with someone.  Chaplain Darcus Pesteramela Holder is available this evening at [phone].  Chaplain Dyanne CarrelKaty Niquita Digioia, Bcc Pager, [phone] 5:00 PM

## 2017-04-18 ENCOUNTER — Encounter (HOSPITAL_COMMUNITY): Payer: Self-pay

## 2017-04-18 ENCOUNTER — Telehealth: Payer: Self-pay | Admitting: Radiology

## 2017-04-18 ENCOUNTER — Inpatient Hospital Stay (HOSPITAL_COMMUNITY): Payer: BLUE CROSS/BLUE SHIELD | Admitting: Anesthesiology

## 2017-04-18 DIAGNOSIS — Z3A38 38 weeks gestation of pregnancy: Secondary | ICD-10-CM

## 2017-04-18 DIAGNOSIS — O134 Gestational [pregnancy-induced] hypertension without significant proteinuria, complicating childbirth: Secondary | ICD-10-CM

## 2017-04-18 LAB — CBC
HCT: 33 % — ABNORMAL LOW (ref 36.0–46.0)
Hemoglobin: 10.5 g/dL — ABNORMAL LOW (ref 12.0–15.0)
MCH: 27.1 pg (ref 26.0–34.0)
MCHC: 31.8 g/dL (ref 30.0–36.0)
MCV: 85.3 fL (ref 78.0–100.0)
Platelets: 303 10*3/uL (ref 150–400)
RBC: 3.87 MIL/uL (ref 3.87–5.11)
RDW: 15 % (ref 11.5–15.5)
WBC: 14.9 10*3/uL — ABNORMAL HIGH (ref 4.0–10.5)

## 2017-04-18 LAB — RPR: RPR Ser Ql: NONREACTIVE

## 2017-04-18 MED ORDER — SIMETHICONE 80 MG PO CHEW: 80.0000 mg | CHEWABLE_TABLET | ORAL | Status: DC | PRN

## 2017-04-18 MED ORDER — COCONUT OIL OIL: 1.0000 "application " | TOPICAL_OIL | Status: DC | PRN

## 2017-04-18 MED ORDER — LIDOCAINE HCL (PF) 1 % IJ SOLN
INTRAMUSCULAR | Status: DC | PRN
  Administered 2017-04-18: 8 mL via EPIDURAL
  Administered 2017-04-18: 4 mL via EPIDURAL

## 2017-04-18 MED ORDER — BENZOCAINE-MENTHOL 20-0.5 % EX AERO
1.0000 "application " | INHALATION_SPRAY | CUTANEOUS | Status: DC | PRN
  Administered 2017-04-18 – 2017-04-19 (×2): 1 via TOPICAL
  Filled 2017-04-18 (×2): qty 56

## 2017-04-18 MED ORDER — LACTATED RINGERS IV SOLN: 500.0000 mL | Freq: Once | INTRAVENOUS | Status: DC

## 2017-04-18 MED ORDER — FENTANYL 2.5 MCG/ML BUPIVACAINE 1/10 % EPIDURAL INFUSION (WH - ANES)
14.0000 mL/h | INTRAMUSCULAR | Status: DC | PRN
  Administered 2017-04-18: 14 mL/h via EPIDURAL
  Filled 2017-04-18: qty 100

## 2017-04-18 MED ORDER — DIPHENHYDRAMINE HCL 50 MG/ML IJ SOLN: 12.5000 mg | INTRAMUSCULAR | Status: DC | PRN

## 2017-04-18 MED ORDER — PHENYLEPHRINE 40 MCG/ML (10ML) SYRINGE FOR IV PUSH (FOR BLOOD PRESSURE SUPPORT): 80.0000 ug | PREFILLED_SYRINGE | INTRAVENOUS | Status: DC | PRN

## 2017-04-18 MED ORDER — EPHEDRINE 5 MG/ML INJ: 10.0000 mg | INTRAVENOUS | Status: DC | PRN

## 2017-04-18 MED ORDER — ACETAMINOPHEN 325 MG PO TABS
650.0000 mg | ORAL_TABLET | ORAL | Status: DC | PRN
  Administered 2017-04-19: 650 mg via ORAL
  Filled 2017-04-18: qty 2

## 2017-04-18 MED ORDER — DIBUCAINE 1 % RE OINT: 1.0000 "application " | TOPICAL_OINTMENT | RECTAL | Status: DC | PRN

## 2017-04-18 MED ORDER — TETANUS-DIPHTH-ACELL PERTUSSIS 5-2.5-18.5 LF-MCG/0.5 IM SUSP: 0.5000 mL | Freq: Once | INTRAMUSCULAR | Status: DC

## 2017-04-18 MED ORDER — OXYTOCIN 40 UNITS IN LACTATED RINGERS INFUSION - SIMPLE MED
1.0000 m[IU]/min | INTRAVENOUS | Status: DC
  Administered 2017-04-18: 2 m[IU]/min via INTRAVENOUS

## 2017-04-18 MED ORDER — PRENATAL MULTIVITAMIN CH
1.0000 | ORAL_TABLET | Freq: Every day | ORAL | Status: DC
  Administered 2017-04-18 – 2017-04-19 (×2): 1 via ORAL
  Filled 2017-04-18 (×2): qty 1

## 2017-04-18 MED ORDER — ONDANSETRON HCL 4 MG PO TABS: 4.0000 mg | ORAL_TABLET | ORAL | Status: DC | PRN

## 2017-04-18 MED ORDER — SENNOSIDES-DOCUSATE SODIUM 8.6-50 MG PO TABS
2.0000 | ORAL_TABLET | ORAL | Status: DC
  Administered 2017-04-18: 2 via ORAL
  Filled 2017-04-18: qty 2

## 2017-04-18 MED ORDER — IBUPROFEN 600 MG PO TABS
600.0000 mg | ORAL_TABLET | Freq: Four times a day (QID) | ORAL | Status: DC
  Administered 2017-04-18 – 2017-04-19 (×7): 600 mg via ORAL
  Filled 2017-04-18 (×6): qty 1

## 2017-04-18 MED ORDER — ZOLPIDEM TARTRATE 5 MG PO TABS: 5.0000 mg | ORAL_TABLET | Freq: Every evening | ORAL | Status: DC | PRN

## 2017-04-18 MED ORDER — DIPHENHYDRAMINE HCL 25 MG PO CAPS
25.0000 mg | ORAL_CAPSULE | Freq: Four times a day (QID) | ORAL | Status: DC | PRN
  Administered 2017-04-18 (×2): 25 mg via ORAL
  Filled 2017-04-18 (×2): qty 1

## 2017-04-18 MED ORDER — ONDANSETRON HCL 4 MG/2ML IJ SOLN: 4.0000 mg | INTRAMUSCULAR | Status: DC | PRN

## 2017-04-18 MED ORDER — PHENYLEPHRINE 40 MCG/ML (10ML) SYRINGE FOR IV PUSH (FOR BLOOD PRESSURE SUPPORT)
80.0000 ug | PREFILLED_SYRINGE | INTRAVENOUS | Status: DC | PRN
  Filled 2017-04-18: qty 10

## 2017-04-18 MED ORDER — WITCH HAZEL-GLYCERIN EX PADS: 1.0000 "application " | MEDICATED_PAD | CUTANEOUS | Status: DC | PRN

## 2017-04-18 NOTE — Telephone Encounter (Signed)
Left message on the cell phone voicemail about appointment with Asher MuirJamie and Bp check @ [facility] on 01/03 @ 10:00. Also the she has a postpartum & PP GTT appointment scheduled for 05/28/17 @ 8:15 (explained to be fasting) and provided number for call back

## 2017-04-18 NOTE — Anesthesia Procedure Notes (Signed)
Epidural Patient location during procedure: OB Start time: 04/18/2017 12:53 AM End time: 04/18/2017 12:57 AM  Staffing Anesthesiologist: Beryle LatheBrock, Consuelo Suthers E, MD Performed: anesthesiologist   Preanesthetic Checklist Completed: patient identified, pre-op evaluation, timeout performed, IV checked, risks and benefits discussed and monitors and equipment checked  Epidural Patient position: sitting Prep: DuraPrep Patient monitoring: continuous pulse ox and blood pressure Approach: midline Location: L2-L3 Injection technique: LOR saline  Needle:  Needle type: Tuohy  Needle gauge: 17 G Needle length: 9 cm Needle insertion depth: 7 cm Catheter size: 19 Gauge Catheter at skin depth: 12 cm Test dose: negative and Other (1% lidocaine)  Additional Notes Patient identified. Risks including, but not limited to, bleeding, infection, nerve damage, paralysis, inadequate analgesia, blood pressure changes, nausea, vomiting, allergic reaction, postpartum back pain, itching, and headache were discussed. Patient expressed understanding and wished to proceed. Sterile prep and drape, including hand hygiene, mask, and sterile gloves were used. The patient was positioned and the spine was prepped. The skin was anesthetized with lidocaine. No paraesthesia or other complication noted. The patient did not experience any signs of intravascular injection such as tinnitus or metallic taste in mouth, nor signs of intrathecal spread such as rapid motor block. Please see nursing notes for vital signs. The patient tolerated the procedure well.   Connie Webster Hetal Proano, MDReason for block:procedure for pain

## 2017-04-18 NOTE — Anesthesia Preprocedure Evaluation (Signed)
Anesthesia Evaluation  Patient identified by MRN, date of birth, ID band Patient awake    Reviewed: Allergy & Precautions, NPO status , Patient's Chart, lab work & pertinent test results  Airway Mallampati: II  TM Distance: >3 FB Neck ROM: Full    Dental  (+) Dental Advisory Given   Pulmonary asthma ,    Pulmonary exam normal breath sounds clear to auscultation       Cardiovascular hypertension, Normal cardiovascular exam Rhythm:Regular Rate:Normal     Neuro/Psych Anxiety Depression negative neurological ROS     GI/Hepatic negative GI ROS, Neg liver ROS,   Endo/Other  diabetes, Gestational  Renal/GU negative Renal ROS  negative genitourinary   Musculoskeletal negative musculoskeletal ROS (+)   Abdominal   Peds  Hematology  (+) anemia ,   Anesthesia Other Findings   Reproductive/Obstetrics                            Anesthesia Physical Anesthesia Plan  ASA: II  Anesthesia Plan: Epidural   Post-op Pain Management:    Induction:   PONV Risk Score and Plan:   Airway Management Planned: Natural Airway  Additional Equipment:   Intra-op Plan:   Post-operative Plan:   Informed Consent: I have reviewed the patients History and Physical, chart, labs and discussed the procedure including the risks, benefits and alternatives for the proposed anesthesia with the patient or authorized representative who has indicated his/her understanding and acceptance.     Plan Discussed with:   Anesthesia Plan Comments: (Labs reviewed. Platelets acceptable, patient not taking any blood thinning medications. Risks and benefits discussed with patient, patient expressed understanding and wished to proceed.)        Anesthesia Quick Evaluation

## 2017-04-18 NOTE — Lactation Note (Signed)
This note was copied from a baby's chart. Lactation Consultation Note  Patient Name: Girl Armando GangKachana Terrell-Brewington [id number]'s Date: 04/18/2017 Reason for consult: Initial assessment;Early term 37-38.6wks Breastfeeding consultation services and support information given and reviewed.  Mom states she breastfed her first baby without difficulty and newborn is feeding frequently and well.  Instructed to feed with any feeding cue and call for assist/concerns prn.  Maternal Data Has patient been taught Hand Expression?: Yes Does the patient have breastfeeding experience prior to this delivery?: Yes  Feeding Feeding Type: Breast Fed  LATCH Score                   Interventions    Lactation Tools Discussed/Used     Consult Status Consult Status: Follow-up Date: 04/19/17 Follow-up type: In-patient    Huston FoleyMOULDEN, Kieli Golladay S 04/18/2017, 3:00 PM

## 2017-04-18 NOTE — Anesthesia Postprocedure Evaluation (Signed)
Anesthesia Post Note  Patient: Music therapistKachana Terrell-Brewington  Procedure(s) Performed: AN AD HOC LABOR EPIDURAL     Patient location during evaluation: Mother Baby Anesthesia Type: Epidural Level of consciousness: awake Pain management: pain level controlled Vital Signs Assessment: post-procedure vital signs reviewed and stable Respiratory status: spontaneous breathing Cardiovascular status: stable Postop Assessment: epidural receding and patient able to bend at knees Anesthetic complications: no    Last Vitals:  Vitals:   04/18/17 0507 04/18/17 0634  BP: 138/77 136/66  Pulse: 98 94  Resp: 18 18  Temp: 36.6 C 36.6 C    Last Pain:  Vitals:   04/18/17 0720  TempSrc:   PainSc: 4    Pain Goal: Patients Stated Pain Goal: 0 (04/17/17 1105)               Edison PaceWILKERSON,Peityn Payton

## 2017-04-19 LAB — GLUCOSE, CAPILLARY: Glucose-Capillary: 95 mg/dL (ref 65–99)

## 2017-04-19 LAB — BIRTH TISSUE RECOVERY COLLECTION (PLACENTA DONATION)

## 2017-04-19 MED ORDER — IBUPROFEN 600 MG PO TABS: 600.0000 mg | ORAL_TABLET | Freq: Four times a day (QID) | ORAL | 0 refills | Status: DC

## 2017-04-19 NOTE — Clinical Social Work Maternal (Signed)
CLINICAL SOCIAL WORK MATERNAL/CHILD NOTE  Patient Details  Name: Connie Webster MRN: [id number] Date of Birth: Mar 13, 1981  Date:  04/19/2017  Clinical Social Worker Initiating Note:  Ambrose Pancoast, LCSW Date/Time: Initiated:  04/19/17/1637     Child's Name:  Connie Webster   Biological Parents:  Mother, Father   Need for Interpreter:      Reason for Referral:  Behavioral Health Concerns   Address:  [place] [address]    Phone number:  [phone] (home)     Additional phone number:   Household Members/Support Persons (HM/SP):   Household Member/Support Person 1, Household Member/Support Person 2   HM/SP Name Relationship DOB or Age  HM/SP -1 Cletis [place] spouse 11/07/86  HM/SP -2 Makan daughter 06/12/07  HM/SP -3        HM/SP -4        HM/SP -5        HM/SP -6        HM/SP -7        HM/SP -8          Natural Supports (not living in the home):  Extended Family, Immediate Family   Professional Supports: Therapist   Employment: Full-time   Type of Work: [place]   Education:      Homebound arranged:    Museum/gallery curator Resources:      Other Resources:      Cultural/Religious Considerations Which May Impact Care:  none identified  Strengths:  Ability to meet basic needs , Home prepared for child    Psychotropic Medications:         Pediatrician:       Pediatrician List:   [facility]      Pediatrician Fax Number:    Risk Factors/Current Problems:  Mental Health Concerns , Compliance with Treatment    Cognitive State:  Goal Oriented , Able to Concentrate    Mood/Affect:  Depressed , Flat    CSW Assessment: LCSW met with MOB and discussed reason for the referral. MOB endorsed that emotionally she was a "roller coaster." She stated that she has a history of depression since age 36.  MOB  stated that she sees a therapist at [facility] and has an appointment scheduled for 05/27/17. She stated that she has been on multiple medications for depression, however the did not like how they make her feel. MOB stated that she and her therapist have been working on identifying triggers of depression and addressing the triggers.  She states that in the past she tried taking medications for six months, being off for three months, but she just does not like the way it makes her feel.  MOB denied as history of PPD with her last pregnancy.  FOB verbalized privately to LCSW that he is concerned about MOB and her depression. He stated that her mother will be her from [place] on 04/24/17 for three weeks to assist the family. He stated that he will be with MOB and infant or he will have his mother come to be with MOB and infant if he has to leave the home. LCSW discussed seeking treatment for MOB if he notices that her symptoms or worsening. LCSW also discussed IVC process should MOB refuse to go to treatment. FOB was agreeable to monitor MOB and seek treatment or IVC if symptom's worsen  or if MOB has SI/HI issues.  LCSW provided education regarding the baby blues period vs. Perinatal mood disorders, discussed treatment and gave resources for mental health follow up if concerns arise. LCSW recommended self-evaluation during the postpartum time period using the New Mom Checklist form Postpartum Progress and encouraged MOB to contact a medical professional if symptoms are noted at any time. LCSW assessed for safety and MOB denied SI, HI, DV.  MOB agreed to seek help if help is needed.  LCSW provided information and education on SIDS.  There are no identifiable barriers to discharge.  CSW Plan/Description:  No Further Intervention Required/No Barriers to Discharge    Ihor Gully, LCSW 04/19/2017, 4:42 PM

## 2017-04-19 NOTE — Discharge Summary (Signed)
OB Discharge Summary     Patient Name: Connie Webster DOB: Jul 01, 1980 MRN: [id number]  Date of admission: 04/17/2017 Delivering MD: Katheren Shams   Date of discharge: 04/19/2017  Admitting diagnosis: 48WKS HBP Intrauterine pregnancy: [redacted]w[redacted]d    Secondary diagnosis:  Active Problems:   AMA (advanced maternal age) multigravida 35+   Gestational hypertension, third trimester   Encounter for induction of labor   Status post delivery at term  Additional problems: none     Discharge diagnosis: Term Pregnancy Delivered and Gestational Hypertension                                                                                                Post partum procedures:none  Augmentation: AROM, Pitocin and Cytotec  Complications: None  Hospital course:  Induction of Labor With Vaginal Delivery   36y.o. yo [id number] 345w3das admitted to the hospital 04/17/2017 for induction of labor.  Indication for induction: Gestational hypertension.  Patient had an uncomplicated labor course as follows: Membrane Rupture Time/Date: 1:13 AM ,04/18/2017   Intrapartum Procedures: Episiotomy: None [1]                                         Lacerations:  None [1]  Patient had delivery of a Viable infant.  Information for the patient's newborn:  TeLayanna, Charoirl KaCache0[[id number]]Delivery Method: Vaginal, Spontaneous(Filed from Delivery Summary)   04/18/2017  Details of delivery can be found in separate delivery note.  Patient had a routine postpartum course. Patient is discharged home 04/19/17.  Physical exam  Vitals:   04/18/17 0634 04/18/17 0930 04/18/17 1700 04/19/17 0547  BP: 136/66 122/65 134/87 128/64  Pulse: 94 (!) 102 96 87  Resp: 18 18 18 18   Temp: 97.9 F (36.6 C) 98.1 F (36.7 C) 97.9 F (36.6 C) 98.1 F (36.7 C)  TempSrc: Oral Oral Oral Oral  SpO2:   98%   Weight:      Height:       General: alert, cooperative and no distress Lochia:  appropriate Uterine Fundus: firm DVT Evaluation: No evidence of DVT seen on physical exam. Negative Homan's sign. No cords or calf tenderness. No significant calf/ankle edema. Labs: Lab Results  Component Value Date   WBC 14.9 (H) 04/18/2017   HGB 10.5 (L) 04/18/2017   HCT 33.0 (L) 04/18/2017   MCV 85.3 04/18/2017   PLT 303 04/18/2017   CMP Latest Ref Rng & Units 04/17/2017  Glucose 65 - 99 mg/dL 91  BUN 6 - 20 mg/dL 6  Creatinine 0.44 - 1.00 mg/dL 0.70  Sodium 135 - 145 mmol/L 136  Potassium 3.5 - 5.1 mmol/L 4.5  Chloride 101 - 111 mmol/L 104  CO2 22 - 32 mmol/L 20(L)  Calcium 8.9 - 10.3 mg/dL 9.5  Total Protein 6.5 - 8.1 g/dL 6.8  Total Bilirubin 0.3 - 1.2 mg/dL 0.6  Alkaline Phos 38 - 126 U/L 230(H)  AST 15 - 41 U/L 23  ALT 14 -  54 U/L 9(L)    Discharge instruction: per After Visit Summary and "Baby and Me Booklet".  After visit meds:  Allergies as of 04/19/2017      Reactions   Peanut-containing Drug Products Anaphylaxis      Medication List    STOP taking these medications   ACCU-CHEK FASTCLIX LANCETS Misc   ACCU-CHEK NANO SMARTVIEW w/Device Kit   glucose blood test strip Commonly known as:  ACCU-CHEK SMARTVIEW     TAKE these medications   albuterol 108 (90 Base) MCG/ACT inhaler Commonly known as:  PROVENTIL HFA;VENTOLIN HFA Inhale 2 puffs into the lungs every 4 (four) hours as needed for wheezing or shortness of breath.   diphenhydrAMINE 25 MG tablet Commonly known as:  BENADRYL Take 25 mg by mouth every 6 (six) hours as needed for itching.   famotidine 20 MG tablet Commonly known as:  PEPCID Take 1 tablet (20 mg total) by mouth 2 (two) times daily.   ibuprofen 600 MG tablet Commonly known as:  ADVIL,MOTRIN Take 1 tablet (600 mg total) by mouth every 6 (six) hours.       Diet: routine diet  Activity: Advance as tolerated. Pelvic rest for 6 weeks.   Outpatient follow up:1 week for BP check, then 4 weeks for postpartum visit Follow up  Appt: Future Appointments  Date Time Provider Felida  04/25/2017 10:00 AM [place] Ladera  05/28/2017  8:15 AM Aletha Halim, MD [facility]   Follow up Visit:No Follow-up on file.  Postpartum contraception: Undecided  Newborn Data: Live born female  Birth Weight: 6 lb 6.5 oz (2905 g) APGAR: 7, 8  Newborn Delivery   Birth date/time:  04/18/2017 03:22:00 Delivery type:  Vaginal, Spontaneous     Baby Feeding: Breast Disposition:home with mother   04/19/2017 Truett Mainland, DO

## 2017-04-19 NOTE — Discharge Instructions (Signed)
Vaginal Delivery, Care After °Refer to this sheet in the next few weeks. These instructions provide you with information about caring for yourself after vaginal delivery. Your health care provider may also give you more specific instructions. Your treatment has been planned according to current medical practices, but problems sometimes occur. Call your health care provider if you have any problems or questions. °What can I expect after the procedure? °After vaginal delivery, it is common to have: °· Some bleeding from your vagina. °· Soreness in your abdomen, your vagina, and the area of skin between your vaginal opening and your anus (perineum). °· Pelvic cramps. °· Fatigue. ° °Follow these instructions at home: °Medicines °· Take over-the-counter and prescription medicines only as told by your health care provider. °· If you were prescribed an antibiotic medicine, take it as told by your health care provider. Do not stop taking the antibiotic until it is finished. °Driving ° °· Do not drive or operate heavy machinery while taking prescription pain medicine. °· Do not drive for 24 hours if you received a sedative. °Lifestyle °· Do not drink alcohol. This is especially important if you are breastfeeding or taking medicine to relieve pain. °· Do not use tobacco products, including cigarettes, chewing tobacco, or e-cigarettes. If you need help quitting, ask your health care provider. °Eating and drinking °· Drink at least 8 eight-ounce glasses of water every day unless you are told not to by your health care provider. If you choose to breastfeed your baby, you may need to drink more water than this. °· Eat high-fiber foods every day. These foods may help prevent or relieve constipation. High-fiber foods include: °? Whole grain cereals and breads. °? Brown rice. °? Beans. °? Fresh fruits and vegetables. °Activity °· Return to your normal activities as told by your health care provider. Ask your health care provider  what activities are safe for you. °· Rest as much as possible. Try to rest or take a nap when your baby is sleeping. °· Do not lift anything that is heavier than your baby or 10 lb (4.5 kg) until your health care provider says that it is safe. °· Talk with your health care provider about when you can engage in sexual activity. This may depend on your: °? Risk of infection. °? Rate of healing. °? Comfort and desire to engage in sexual activity. °Vaginal Care °· If you have an episiotomy or a vaginal tear, check the area every day for signs of infection. Check for: °? More redness, swelling, or pain. °? More fluid or blood. °? Warmth. °? Pus or a bad smell. °· Do not use tampons or douches until your health care provider says this is safe. °· Watch for any blood clots that may pass from your vagina. These may look like clumps of dark red, brown, or black discharge. °General instructions °· Keep your perineum clean and dry as told by your health care provider. °· Wear loose, comfortable clothing. °· Wipe from front to back when you use the toilet. °· Ask your health care provider if you can shower or take a bath. If you had an episiotomy or a perineal tear during labor and delivery, your health care provider may tell you not to take baths for a certain length of time. °· Wear a bra that supports your breasts and fits you well. °· If possible, have someone help you with household activities and help care for your baby for at least a few days after   you leave the hospital. °· Keep all follow-up visits for you and your baby as told by your health care provider. This is important. °Contact a health care provider if: °· You have: °? Vaginal discharge that has a bad smell. °? Difficulty urinating. °? Pain when urinating. °? A sudden increase or decrease in the frequency of your bowel movements. °? More redness, swelling, or pain around your episiotomy or vaginal tear. °? More fluid or blood coming from your episiotomy or  vaginal tear. °? Pus or a bad smell coming from your episiotomy or vaginal tear. °? A fever. °? A rash. °? Little or no interest in activities you used to enjoy. °? Questions about caring for yourself or your baby. °· Your episiotomy or vaginal tear feels warm to the touch. °· Your episiotomy or vaginal tear is separating or does not appear to be healing. °· Your breasts are painful, hard, or turn red. °· You feel unusually sad or worried. °· You feel nauseous or you vomit. °· You pass large blood clots from your vagina. If you pass a blood clot from your vagina, save it to show to your health care provider. Do not flush blood clots down the toilet without having your health care provider look at them. °· You urinate more than usual. °· You are dizzy or light-headed. °· You have not breastfed at all and you have not had a menstrual period for 12 weeks after delivery. °· You have stopped breastfeeding and you have not had a menstrual period for 12 weeks after you stopped breastfeeding. °Get help right away if: °· You have: °? Pain that does not go away or does not get better with medicine. °? Chest pain. °? Difficulty breathing. °? Blurred vision or spots in your vision. °? Thoughts about hurting yourself or your baby. °· You develop pain in your abdomen or in one of your legs. °· You develop a severe headache. °· You faint. °· You bleed from your vagina so much that you fill two sanitary pads in one hour. °This information is not intended to replace advice given to you by your health care provider. Make sure you discuss any questions you have with your health care provider. °Document Released: 04/06/2000 Document Revised: 09/21/2015 Document Reviewed: 04/24/2015 °Elsevier Interactive Patient Education © 2018 Elsevier Inc. ° °

## 2017-04-19 NOTE — Lactation Note (Signed)
This note was copied from a baby's chart. Lactation Consultation Note  Patient Name: Connie Webster UEAVW'UToday's Date: 04/19/2017 Reason for consult: Follow-up assessment;Early term 37-38.6wks;Infant weight loss  Baby is 738 hours old  Mom/ baby for later discharge today  LC reviewed doc flow sheets with mom and updated Baby awake and hungry and mom  Latched the baby using the cradle position.  Was able to obtain depth and swallows noted. Baby fed for 7 mins and released,  Acted satisfied. LC recommended to mom to try to cross cradle and then switch arms to  Cradle.  Discussed nutritive vs non - nutritive feeding patterns, and to watch for hanging out latched.  STS feedings until the baby can stay awake for a feeding.  Sore nipple and engorgement prevention and tx reviewed.  Per mom has a DEBP at home.  Mother informed of post-discharge support and given phone number to the lactation department, including services for phone call assistance; out-patient appointments; and breastfeeding support group. List of other breastfeeding resources in the community given in the handout. Encouraged mother to call for problems or concerns related to breastfeeding.   Maternal Data Has patient been taught Hand Expression?: Yes  Feeding Feeding Type: Breast Fed Length of feed: 7 min  LATCH Score Latch: Grasps breast easily, tongue down, lips flanged, rhythmical sucking.  Audible Swallowing: A few with stimulation  Type of Nipple: Everted at rest and after stimulation  Comfort (Breast/Nipple): Soft / non-tender  Hold (Positioning): Assistance needed to correctly position infant at breast and maintain latch.  LATCH Score: 8  Interventions Interventions: Breast feeding basics reviewed;Assisted with latch;Breast compression  Lactation Tools Discussed/Used WIC Program: No Pump Review: Milk Storage Initiated by:: MAI  Date initiated:: 04/19/17   Consult Status Consult Status:  Complete Date: 04/19/17    Connie Webster 04/19/2017, 5:28 PM

## 2017-04-24 ENCOUNTER — Inpatient Hospital Stay (HOSPITAL_COMMUNITY)
Admission: RE | Admit: 2017-04-24 | Discharge: 2017-04-24 | Disposition: A | Discharge status: Home / Self Care | Payer: BLUE CROSS/BLUE SHIELD | Source: Ambulatory Visit | Attending: Obstetrics & Gynecology | Admitting: Obstetrics & Gynecology

## 2017-04-25 ENCOUNTER — Institutional Professional Consult (permissible substitution): Payer: BLUE CROSS/BLUE SHIELD

## 2017-04-25 NOTE — BH Specialist Note (Unsigned)
Integrated Behavioral Health Initial Visit  MRN: [id number] Name: Connie Webster  Number of Integrated Behavioral Health Clinician visits:: 1/6 Session Start time: ***  Session End time: *** Total time: {IBH Total Time:21014050}  Type of Service: Integrated Behavioral Health- Individual/Family Interpretor:No. Interpretor Name and Language: n/a   Warm Hand Off Completed.       SUBJECTIVE: Connie Webster is a 37 y.o. female accompanied by {CHL AMB ACCOMPANIED WU:[id number]}BY:[phone]} Patient was referred by Dr Alvester MorinNewton for ***. Patient reports the following symptoms/concerns: *** Duration of problem: ***; Severity of problem: {Mild/Moderate/Severe:20260}  OBJECTIVE: Mood: {BHH MOOD:22306} and Affect: {BHH AFFECT:22307} Risk of harm to self or others: {CHL AMB BH Suicide Current Mental Status:21022748}  LIFE CONTEXT: Family and Social: *** School/Work: *** Self-Care: *** Life Changes: Recent childbirth ***  GOALS ADDRESSED: Patient will: 1. Reduce symptoms of: {IBH Symptoms:21014056} 2. Increase knowledge and/or ability of: {IBH Patient Tools:21014057}  3. Demonstrate ability to: {IBH Goals:21014053}  INTERVENTIONS: Interventions utilized: {IBH Interventions:21014054}  Standardized Assessments completed: {IBH Screening Tools:21014051}  ASSESSMENT: Patient currently experiencing ***.   Patient may benefit from psychoeducation and brief therapeutic interventions regarding coping with symptoms of *** .  PLAN: 1. Follow up with behavioral health clinician on : *** 2. Behavioral recommendations:  -*** -*** -Read educational materials regarding coping with symptoms of ***  3. Referral(s): {IBH Referrals:21014055} 4. "From scale of 1-10, how likely are you to follow plan?": ***  Rae LipsJamie C McMannes, LCSW  Edinburgh Postnatal Depression Scale - 04/19/17 1000      Edinburgh Postnatal Depression Scale:  In the Past 7 Days   I have been able to laugh and see  the funny side of things.  1    I have looked forward with enjoyment to things.  1    I have blamed myself unnecessarily when things went wrong.  2    I have been anxious or worried for no good reason.  2    I have felt scared or panicky for no good reason.  2    Things have been getting on top of me.  2    I have been so unhappy that I have had difficulty sleeping.  3    I have felt sad or miserable.  2    I have been so unhappy that I have been crying.  3    The thought of harming myself has occurred to me.  1    Edinburgh Postnatal Depression Scale Total  19

## 2017-05-01 ENCOUNTER — Other Ambulatory Visit: Payer: Self-pay | Admitting: *Deleted

## 2017-05-01 MED ORDER — IBUPROFEN 600 MG PO TABS: 600.0000 mg | ORAL_TABLET | Freq: Four times a day (QID) | ORAL | 0 refills | Status: DC

## 2017-05-28 ENCOUNTER — Encounter: Payer: Self-pay | Admitting: Obstetrics and Gynecology

## 2017-05-28 ENCOUNTER — Encounter (HOSPITAL_COMMUNITY): Payer: Self-pay

## 2017-05-28 ENCOUNTER — Ambulatory Visit (INDEPENDENT_AMBULATORY_CARE_PROVIDER_SITE_OTHER): Payer: BLUE CROSS/BLUE SHIELD | Admitting: Obstetrics and Gynecology

## 2017-05-28 ENCOUNTER — Encounter: Payer: Self-pay | Admitting: Radiology

## 2017-05-28 VITALS — BP 136/86 | HR 72 | Ht 63.25 in | Wt 152.0 lb

## 2017-05-28 DIAGNOSIS — O24419 Gestational diabetes mellitus in pregnancy, unspecified control: Secondary | ICD-10-CM

## 2017-05-28 DIAGNOSIS — O135 Gestational [pregnancy-induced] hypertension without significant proteinuria, complicating the puerperium: Secondary | ICD-10-CM

## 2017-05-28 DIAGNOSIS — R030 Elevated blood-pressure reading, without diagnosis of hypertension: Secondary | ICD-10-CM

## 2017-05-28 NOTE — Progress Notes (Signed)
Post Partum Exam  Connie Webster is a 37 y.o. 683P2012 female who presents for a postpartum visit. She is 5 weeks postpartum following a spontaneous vaginal delivery. I have fully reviewed the prenatal and intrapartum course. The delivery was at 38.3 gestational weeks.  Anesthesia: epidural. Postpartum course has been positive for back pain. Baby's course has been unremarkable. Baby is feeding by both breast and bottle - Enfamil Newborn. Bleeding no bleeding. Bowel function is normal. Bladder function is normal. Patient is not sexually active. Contraception method is Undecided. Postpartum depression screening: Negative - Score=4

## 2017-05-28 NOTE — Progress Notes (Signed)
Obstetrics Visit Postpartum Visit  Appointment Date: 05/28/2017  OB[facility]N Clinic[facility]  [facility]  Chief Complaint:  Chief Complaint  Patient presents with  . Postpartum Care    History of Present Illness: Connie Webster is a 37 y.o. African-American [id number] (Patient's last menstrual period was 07/07/2016 (approximate).), seen for the above chief complaint. Her past medical history is significant for gHTN and GDMa1   She is s/p SVD/intact perineum on 12/27; she was discharged to home on PPD#1  Vaginal bleeding or discharge: No  Breast or formula feeding: breastfeeding q2-3h Intercourse: No  Contraception after delivery: No  PP depression s/s: No  Any bowel or bladder issues: No  Pap smear: no abnormalities (date: 2018)  She denies any s/s of pre-eclampsia  Review of Systems: as noted in the History of Present Illness.  Medications Aerionna Webster had no medications administered during this visit. Current Outpatient Medications  Medication Sig Dispense Refill  . albuterol (PROVENTIL HFA;VENTOLIN HFA) 108 (90 BASE) MCG/ACT inhaler Inhale 2 puffs into the lungs every 4 (four) hours as needed for wheezing or shortness of breath.    . diphenhydrAMINE (BENADRYL) 25 MG tablet Take 25 mg by mouth every 6 (six) hours as needed for itching.    . famotidine (PEPCID) 20 MG tablet Take 1 tablet (20 mg total) by mouth 2 (two) times daily. 60 tablet 3  . ibuprofen (ADVIL,MOTRIN) 600 MG tablet Take 1 tablet (600 mg total) by mouth every 6 (six) hours. 90 tablet 0   No current facility-administered medications for this visit.     Allergies Peanut-containing drug products  Physical Exam:  BP 136/86   Pulse 72   Ht 5' 3.25" (1.607 m)   Wt 152 lb (68.9 kg)   LMP 07/07/2016 (Approximate)   BMI 26.71 kg/m  Body mass index is 26.71 kg/m. General appearance: Well nourished, well developed female in no  acute distress.   Laboratory: none  PP Depression Screening:  EPDS 4  Assessment: pt doing well  Plan:  Repeat BP normal, with 1st one just mildly elevated. Pt told to keep eye on it at annual visits. F/u PP GTT  Pt interested in BTL; r/b/a d/w her and she still would like it. Instructions on lactational amenorrhea and [facility].  RTC PRN  Cornelia Copaharlie Brnadon Eoff, Jr MD Attending [facility](Faculty Practice)

## 2017-05-29 LAB — COMPREHENSIVE METABOLIC PANEL
ALT: 16 IU/L (ref 0–32)
AST: 22 IU/L (ref 0–40)
Albumin/Globulin Ratio: 1.8 (ref 1.2–2.2)
Albumin: 4.4 g/dL (ref 3.5–5.5)
Alkaline Phosphatase: 87 IU/L (ref 39–117)
BUN/Creatinine Ratio: 9 (ref 9–23)
BUN: 9 mg/dL (ref 6–20)
Bilirubin Total: 0.2 mg/dL (ref 0.0–1.2)
CO2: 23 mmol/L (ref 20–29)
Calcium: 9.1 mg/dL (ref 8.7–10.2)
Chloride: 102 mmol/L (ref 96–106)
Creatinine, Ser: 0.95 mg/dL (ref 0.57–1.00)
GFR calc Af Amer: 89 mL/min/{1.73_m2} (ref >59)
GFR calc non Af Amer: 77 mL/min/{1.73_m2} (ref >59)
Globulin, Total: 2.5 g/dL (ref 1.5–4.5)
Glucose: 107 mg/dL — ABNORMAL HIGH (ref 65–99)
Potassium: 4.2 mmol/L (ref 3.5–5.2)
Sodium: 140 mmol/L (ref 134–144)
Total Protein: 6.9 g/dL (ref 6.0–8.5)

## 2017-05-29 LAB — CBC
Hematocrit: 39.1 % (ref 34.0–46.6)
Hemoglobin: 12.4 g/dL (ref 11.1–15.9)
MCH: 26.1 pg — ABNORMAL LOW (ref 26.6–33.0)
MCHC: 31.7 g/dL (ref 31.5–35.7)
MCV: 82 fL (ref 79–97)
Platelets: 287 10*3/uL (ref 150–379)
RBC: 4.76 x10E6/uL (ref 3.77–5.28)
RDW: 17.6 % — ABNORMAL HIGH (ref 12.3–15.4)
WBC: 5.6 10*3/uL (ref 3.4–10.8)

## 2017-05-29 LAB — GLUCOSE TOLERANCE, 2 HOURS
Glucose, 2 hour: 103 mg/dL (ref 65–139)
Glucose, GTT - Fasting: 76 mg/dL (ref 65–99)

## 2017-05-29 LAB — PROTEIN / CREATININE RATIO, URINE
Creatinine, Urine: 209.1 mg/dL
Protein, Ur: 11.9 mg/dL
Protein/Creat Ratio: 57 mg/g creat (ref 0–200)

## 2017-06-10 NOTE — Patient Instructions (Addendum)
Your procedure is scheduled on: Monday, March 4  Enter through the [facility]tr[facility] of [facility] at: 8 am  Pick up the phone at the desk and dial [phone].  Call this number if you have problems the morning of surgery: [phone].  Remember: Do NOT eat or Do NOT drink clear liquids (including water) after midnight Sunday.  Take these medicines the morning of surgery with a SIP OF WATER: None  Bring albuterol inhaler with you on day of surgery.  Do Not smoke on the day of surgery.  Stop herbal medications and supplements at this time.  Do NOT wear jewelry (body piercing), metal hair clips/bobby pins, make-up, or nail polish. Do NOT wear lotions, powders, or perfumes.  You may wear deoderant. Do NOT shave for 48 hours prior to surgery. Do NOT bring valuables to the hospital. Contacts, dentures, or bridgework may not be worn into surgery.  Leave suitcase in car.  After surgery it may be brought to your room.  For patients admitted to the hospital, checkout time is 11:00 AM the day of discharge. Have a responsible adult drive you home and stay with you for 24 hours after your procedure

## 2017-06-12 ENCOUNTER — Telehealth: Payer: Self-pay | Admitting: *Deleted

## 2017-06-12 MED ORDER — NORGESTIM-ETH ESTRAD TRIPHASIC 0.18/0.215/0.25 MG-25 MCG PO TABS: 1.0000 | ORAL_TABLET | Freq: Every day | ORAL | 11 refills | Status: DC

## 2017-06-12 NOTE — Telephone Encounter (Signed)
-----   Message from Lindell SparHeather L Bacon, [place] sent at 06/12/2017 11:50 AM EST ----- Regarding: [facility] but has decided that [facility] to [facility] [address]

## 2017-06-17 ENCOUNTER — Inpatient Hospital Stay (HOSPITAL_COMMUNITY)
Admission: RE | Admit: 2017-06-17 | Discharge: 2017-06-17 | Disposition: A | Discharge status: Home / Self Care | Payer: Self-pay | Source: Ambulatory Visit

## 2017-06-24 ENCOUNTER — Encounter (HOSPITAL_COMMUNITY): Admission: AD | Payer: Self-pay | Source: Ambulatory Visit

## 2017-06-24 ENCOUNTER — Ambulatory Visit (HOSPITAL_COMMUNITY)
Admission: AD | Admit: 2017-06-24 | Payer: BLUE CROSS/BLUE SHIELD | Source: Ambulatory Visit | Admitting: Obstetrics and Gynecology

## 2017-06-24 SURGERY — LIGATION, FALLOPIAN TUBE, LAPAROSCOPIC
Anesthesia: Choice | Laterality: Bilateral

## 2018-02-10 DIAGNOSIS — J4520 Mild intermittent asthma, uncomplicated: Secondary | ICD-10-CM | POA: Diagnosis not present

## 2018-02-10 DIAGNOSIS — Z0000 Encounter for general adult medical examination without abnormal findings: Secondary | ICD-10-CM | POA: Diagnosis not present

## 2018-02-10 DIAGNOSIS — R739 Hyperglycemia, unspecified: Secondary | ICD-10-CM | POA: Diagnosis not present

## 2018-02-10 DIAGNOSIS — Z8632 Personal history of gestational diabetes: Secondary | ICD-10-CM | POA: Diagnosis not present

## 2018-02-12 DIAGNOSIS — Z23 Encounter for immunization: Secondary | ICD-10-CM | POA: Diagnosis not present

## 2018-05-08 DIAGNOSIS — J019 Acute sinusitis, unspecified: Secondary | ICD-10-CM | POA: Diagnosis not present

## 2018-05-08 DIAGNOSIS — R8290 Unspecified abnormal findings in urine: Secondary | ICD-10-CM | POA: Diagnosis not present

## 2018-05-08 DIAGNOSIS — R3989 Other symptoms and signs involving the genitourinary system: Secondary | ICD-10-CM | POA: Diagnosis not present

## 2018-06-27 DIAGNOSIS — R69 Illness, unspecified: Secondary | ICD-10-CM | POA: Diagnosis not present

## 2018-06-27 DIAGNOSIS — N926 Irregular menstruation, unspecified: Secondary | ICD-10-CM | POA: Diagnosis not present

## 2018-07-28 DIAGNOSIS — R69 Illness, unspecified: Secondary | ICD-10-CM | POA: Diagnosis not present

## 2018-10-29 DIAGNOSIS — Z20828 Contact with and (suspected) exposure to other viral communicable diseases: Secondary | ICD-10-CM | POA: Diagnosis not present

## 2018-11-29 DIAGNOSIS — R112 Nausea with vomiting, unspecified: Secondary | ICD-10-CM | POA: Diagnosis not present

## 2018-11-29 DIAGNOSIS — G44209 Tension-type headache, unspecified, not intractable: Secondary | ICD-10-CM | POA: Diagnosis not present

## 2019-01-05 DIAGNOSIS — R69 Illness, unspecified: Secondary | ICD-10-CM | POA: Diagnosis not present

## 2019-02-12 ENCOUNTER — Ambulatory Visit (INDEPENDENT_AMBULATORY_CARE_PROVIDER_SITE_OTHER): Payer: 59 | Admitting: Obstetrics and Gynecology

## 2019-02-12 ENCOUNTER — Other Ambulatory Visit: Payer: Self-pay

## 2019-02-12 ENCOUNTER — Encounter: Payer: Self-pay | Admitting: Obstetrics and Gynecology

## 2019-02-12 VITALS — BP 149/102 | HR 74 | Wt 166.4 lb

## 2019-02-12 DIAGNOSIS — O10919 Unspecified pre-existing hypertension complicating pregnancy, unspecified trimester: Secondary | ICD-10-CM | POA: Insufficient documentation

## 2019-02-12 DIAGNOSIS — Z8632 Personal history of gestational diabetes: Secondary | ICD-10-CM | POA: Insufficient documentation

## 2019-02-12 DIAGNOSIS — Z3189 Encounter for other procreative management: Secondary | ICD-10-CM

## 2019-02-12 DIAGNOSIS — R030 Elevated blood-pressure reading, without diagnosis of hypertension: Secondary | ICD-10-CM | POA: Diagnosis not present

## 2019-02-12 NOTE — Progress Notes (Signed)
Pt. Is here to discuss tubal ligation but states she may be trying for a child within the next 6 months  Pt. States she has severe anxiety and blood pressure remains high when not at home

## 2019-02-12 NOTE — Progress Notes (Signed)
Obstetrics and Gynecology Visit Return Patient Evaluation  Appointment Date: 02/12/2019  Primary Care Provider: [facility]: Center for [facility]  Chief Complaint: fertility planning  History of Present Illness:  Connie Webster is a 38 y.o. G3P2 (LMP: one month ago) with above CC.   Patient not really interested in more children but husband is and since she had a difficult and high risk pregnancy last time she is wondering if okay to try and have another child. She delivered in late 2018 and had a pregnancy c/b gHTN and GDMa1; she was set up for a BTL but cancelled  Review of Systems: as noted in the History of Present Illness.  Medications:  Connie Webster had no medications administered during this visit. Current Outpatient Medications  Medication Sig Dispense Refill  . acetaminophen (TYLENOL) 500 MG tablet Take 500 mg by mouth daily as needed for moderate pain or headache.    . albuterol (PROVENTIL HFA;VENTOLIN HFA) 108 (90 BASE) MCG/ACT inhaler Inhale 2 puffs into the lungs every 4 (four) hours as needed for wheezing or shortness of breath.    . doxylamine, Sleep, (UNISOM) 25 MG tablet Take 25 mg by mouth at bedtime as needed for sleep.     No current facility-administered medications for this visit.     Allergies: is allergic to peanut-containing drug products.  Physical Exam:  BP (!) 149/102   Pulse 74   Wt 166 lb 6.4 oz (75.5 kg)   LMP 01/08/2019 (Exact Date)   BMI 29.24 kg/m  Body mass index is 29.24 kg/m. General appearance: Well nourished, well developed female in no acute distress.  Neuro/Psych:  Normal mood and affect.    Assessment: pt stable  Plan:  1. Transient hypertension Pt states it's white coat HTN. Will forward to pcp given their last note.   2. Encounter for fertility planning I told her that medically speaking it would be best if her health, in terms of her HTN was better controlled  prior to pregnancy; she states she had a GTT after pregnancy and it was normal. I told her that she talk to her husband re: getting pregnant again since it sounds like she doesn't want another pregnancy, as it is ultimately her decision. I also told her that a BTL doesn't affect her periods and that if she wants something to help with that that she needs some type of hormonal treatment; she states she has every other month periods which has been normal for her for years. If she does want to get pregnant, I recommend she start folic acid and likely start HTN medications that is safe in case she gets pregnant, such as norvasc, procardia xl, labetalol, HCTZ; I told her I defer to her PCP re: need to start medications but is showed up to her new OB appt with her BPs, I'd recommend starting medications    RTC: PRN  Connie Romans MD Attending Center for [facility])

## 2019-02-18 DIAGNOSIS — I10 Essential (primary) hypertension: Secondary | ICD-10-CM | POA: Diagnosis not present

## 2019-04-20 DIAGNOSIS — I10 Essential (primary) hypertension: Secondary | ICD-10-CM | POA: Diagnosis not present

## 2019-04-24 NOTE — L&D Delivery Note (Signed)
       Delivery Note   Fujiko Picazo is a 39 y.o. [id number] at [redacted]w[redacted]d Estimated Date of Delivery: 03/16/20  PRE-OPERATIVE DIAGNOSIS:  1) [redacted]w[redacted]d pregnancy.  2) Intrauterine growth restriction  3) chronic hypertension   POST-OPERATIVE DIAGNOSIS:  1) [redacted]w[redacted]d pregnancy s/p Vaginal, Spontaneous    Delivery Type: Vaginal, Spontaneous    Delivery Anesthesia:  Epidural   Labor Complications:  None    ESTIMATED BLOOD LOSS: 250 ml    FINDINGS:   1) female infant, Apgar scores of 7   at 1 minute and 8   at 5 minutes and a birthweight of 73.02  ounces.    2) Nuchal cord: no  SPECIMENS:   PLACENTA:   Appearance:  Intact, 3 vessel cord, cord blood sample collected   Removal:    spontaneous   Disposition:  to pathology   DISPOSITION:  Infant to left in stable condition in the delivery room, with L&D personnel and mother,  NARRATIVE SUMMARY: Labor course:  Ms. Amonda Brillhart is a [id number] at [redacted]w[redacted]d who presented for induction of labor.  She progressed well in labor without pitocin.  She received the appropriate anesthesia and proceeded to complete dilation. She evidenced good maternal expulsive effort during the second stage. She went on to deliver a viable female infant " Micha" in direct OA. The placenta delivered without problems and was noted to be complete. Sent to pathology for evaluation. A perineal and vaginal examination was performed. Lacerations:   none. The patient tolerated this well.  Doreene Burke, CNM  02/20/2020 4:06 PM

## 2019-07-20 ENCOUNTER — Ambulatory Visit (INDEPENDENT_AMBULATORY_CARE_PROVIDER_SITE_OTHER): Payer: 59 | Admitting: Certified Nurse Midwife

## 2019-07-20 ENCOUNTER — Encounter: Payer: Self-pay | Admitting: Certified Nurse Midwife

## 2019-07-20 ENCOUNTER — Other Ambulatory Visit: Payer: Self-pay

## 2019-07-20 VITALS — BP 118/85 | HR 82 | Ht 63.25 in | Wt 151.6 lb

## 2019-07-20 DIAGNOSIS — N912 Amenorrhea, unspecified: Secondary | ICD-10-CM | POA: Diagnosis not present

## 2019-07-20 LAB — POCT URINE PREGNANCY: Preg Test, Ur: POSITIVE — AB

## 2019-07-20 NOTE — Patient Instructions (Signed)

## 2019-07-20 NOTE — Progress Notes (Signed)
Subjective:    Connie Webster is a 39 y.o. female who presents for evaluation of amenorrhea. She believes she could be pregnant. Pregnancy is desired. Sexual Activity: single partner, contraception: none. Current symptoms also include: nausea. Last period was normal.   No LMP recorded. The following portions of the patient's history were reviewed and updated as appropriate: allergies, current medications, past family history, past medical history, past social history, past surgical history and problem list.  Review of Systems Pertinent items are noted in HPI.     Objective:    There were no vitals taken for this visit. General: alert, cooperative, appears stated age and no acute distress    Lab Review Urine HCG: positive    Assessment:    Absence of menstruation.     Plan:   Positive: EDC: 03/08/20. Briefly discussed pre-natal care options. Discussed MD or Midwifery care. Pt request to be midwife pt.  Encouraged well-balanced diet, plenty of rest when needed, pre-natal vitamins daily and walking for exercise. Discussed self-help for nausea, avoiding OTC medications until consulting provider or pharmacist, other than Tylenol as needed, minimal caffeine (1-2 cups daily) and avoiding alcohol. She will schedule her u/s asap, nurse visit @10  wks and initial NOB visit @ 12-13 wks . Feel free to call with any questions.   , CNM

## 2019-07-23 ENCOUNTER — Other Ambulatory Visit: Payer: Self-pay | Admitting: Certified Nurse Midwife

## 2019-07-23 ENCOUNTER — Ambulatory Visit (INDEPENDENT_AMBULATORY_CARE_PROVIDER_SITE_OTHER): Payer: 59

## 2019-07-23 ENCOUNTER — Other Ambulatory Visit: Payer: Self-pay

## 2019-07-23 DIAGNOSIS — Z789 Other specified health status: Secondary | ICD-10-CM

## 2019-07-23 DIAGNOSIS — Z3687 Encounter for antenatal screening for uncertain dates: Secondary | ICD-10-CM

## 2019-07-23 DIAGNOSIS — Z3A01 Less than 8 weeks gestation of pregnancy: Secondary | ICD-10-CM | POA: Diagnosis not present

## 2019-08-05 ENCOUNTER — Telehealth: Payer: Self-pay | Admitting: Certified Nurse Midwife

## 2019-08-05 NOTE — Telephone Encounter (Signed)
Patient called stating she is having a hard time making it through the day, lots of nausea for the past week. Patient called in requesting that a provider returns her phone call.

## 2019-08-06 ENCOUNTER — Telehealth: Payer: Self-pay

## 2019-08-06 ENCOUNTER — Other Ambulatory Visit: Payer: Self-pay

## 2019-08-06 MED ORDER — ONDANSETRON 4 MG PO TBDP: 4.0000 mg | ORAL_TABLET | Freq: Four times a day (QID) | ORAL | 2 refills | Status: DC | PRN

## 2019-08-06 MED ORDER — DOXYLAMINE-PYRIDOXINE 10-10 MG PO TBEC: 2.0000 | DELAYED_RELEASE_TABLET | Freq: Every day | ORAL | 5 refills | Status: DC

## 2019-08-06 NOTE — Telephone Encounter (Signed)
PA received from [facility] for Diclegis. Diclegis d/c'd and zofran ordered.

## 2019-08-06 NOTE — Telephone Encounter (Signed)
Per patient request- something for nausea- diclegis sent to [facility].

## 2019-08-07 ENCOUNTER — Encounter: Payer: 59 | Admitting: Advanced Practice Midwife

## 2019-08-10 ENCOUNTER — Ambulatory Visit (INDEPENDENT_AMBULATORY_CARE_PROVIDER_SITE_OTHER): Payer: 59 | Admitting: Certified Nurse Midwife

## 2019-08-10 ENCOUNTER — Other Ambulatory Visit: Payer: Self-pay

## 2019-08-10 VITALS — BP 135/90 | HR 76 | Ht 63.25 in | Wt 153.2 lb

## 2019-08-10 DIAGNOSIS — Z3491 Encounter for supervision of normal pregnancy, unspecified, first trimester: Secondary | ICD-10-CM

## 2019-08-10 NOTE — Progress Notes (Signed)
Bertram Savin Terrell-Brewington presents for NOB nurse interview visit. Pregnancy confirmation done 07/20/2019. G4. W8889. Pregnancy education material explained and given. 0 cats in home. NOB labs ordered. Sickle cell ordered due to patient's race. HIV labs and drug screen were explained and ordered. PNV encouraged. Genetic screening options discussed. Genetic testing: Declined.  Patient may discuss with the provider. Patient to follow up with provider on  08/24/2019 for NOB physical. All questions answered.

## 2019-08-10 NOTE — Patient Instructions (Signed)
First Trimester of Pregnancy  The first trimester of pregnancy is from week 1 until the end of week 13 (months 1 through 3). During this time, your baby will begin to develop inside you. At 6-8 weeks, the eyes and face are formed, and the heartbeat can be seen on ultrasound. At the end of 12 weeks, all the baby's organs are formed. Prenatal care is all the medical care you receive before the birth of your baby. Make sure you get good prenatal care and follow all of your doctor's instructions. Follow these instructions at home: Medicines  Take over-the-counter and prescription medicines only as told by your doctor. Some medicines are safe and some medicines are not safe during pregnancy.  Take a prenatal vitamin that contains at least 600 micrograms (mcg) of folic acid.  If you have trouble pooping (constipation), take medicine that will make your stool soft (stool softener) if your doctor approves. Eating and drinking   Eat regular, healthy meals.  Your doctor will tell you the amount of weight gain that is right for you.  Avoid raw meat and uncooked cheese.  If you feel sick to your stomach (nauseous) or throw up (vomit): ? Eat 4 or 5 small meals a day instead of 3 large meals. ? Try eating a few soda crackers. ? Drink liquids between meals instead of during meals.  To prevent constipation: ? Eat foods that are high in fiber, like fresh fruits and vegetables, whole grains, and beans. ? Drink enough fluids to keep your pee (urine) clear or pale yellow. Activity  Exercise only as told by your doctor. Stop exercising if you have cramps or pain in your lower belly (abdomen) or low back.  Do not exercise if it is too hot, too humid, or if you are in a place of great height (high altitude).  Try to avoid standing for long periods of time. Move your legs often if you must stand in one place for a long time.  Avoid heavy lifting.  Wear low-heeled shoes. Sit and stand up  straight.  You can have sex unless your doctor tells you not to. Relieving pain and discomfort  Wear a good support bra if your breasts are sore.  Take warm water baths (sitz baths) to soothe pain or discomfort caused by hemorrhoids. Use hemorrhoid cream if your doctor says it is okay.  Rest with your legs raised if you have leg cramps or low back pain.  If you have puffy, bulging veins (varicose veins) in your legs: ? Wear support hose or compression stockings as told by your doctor. ? Raise (elevate) your feet for 15 minutes, 3-4 times a day. ? Limit salt in your food. Prenatal care  Schedule your prenatal visits by the twelfth week of pregnancy.  Write down your questions. Take them to your prenatal visits.  Keep all your prenatal visits as told by your doctor. This is important. Safety  Wear your seat belt at all times when driving.  Make a list of emergency phone numbers. The list should include numbers for family, friends, the hospital, and police and fire departments. General instructions  Ask your doctor for a referral to a local prenatal class. Begin classes no later than at the start of month 6 of your pregnancy.  Ask for help if you need counseling or if you need help with nutrition. Your doctor can give you advice or tell you where to go for help.  Do not use hot tubs, steam   rooms, or saunas.  Do not douche or use tampons or scented sanitary pads.  Do not cross your legs for long periods of time.  Avoid all herbs and alcohol. Avoid drugs that are not approved by your doctor.  Do not use any tobacco products, including cigarettes, chewing tobacco, and electronic cigarettes. If you need help quitting, ask your doctor. You may get counseling or other support to help you quit.  Avoid cat litter boxes and soil used by cats. These carry germs that can cause birth defects in the baby and can cause a loss of your baby (miscarriage) or stillbirth.  Visit your dentist.  At home, brush your teeth with a soft toothbrush. Be gentle when you floss. Contact a doctor if:  You are dizzy.  You have mild cramps or pressure in your lower belly.  You have a nagging pain in your belly area.  You continue to feel sick to your stomach, you throw up, or you have watery poop (diarrhea).  You have a bad smelling fluid coming from your vagina.  You have pain when you pee (urinate).  You have increased puffiness (swelling) in your face, hands, legs, or ankles. Get help right away if:  You have a fever.  You are leaking fluid from your vagina.  You have spotting or bleeding from your vagina.  You have very bad belly cramping or pain.  You gain or lose weight rapidly.  You throw up blood. It may look like coffee grounds.  You are around people who have German measles, fifth disease, or chickenpox.  You have a very bad headache.  You have shortness of breath.  You have any kind of trauma, such as from a fall or a car accident. Summary  The first trimester of pregnancy is from week 1 until the end of week 13 (months 1 through 3).  To take care of yourself and your unborn baby, you will need to eat healthy meals, take medicines only if your doctor tells you to do so, and do activities that are safe for you and your baby.  Keep all follow-up visits as told by your doctor. This is important as your doctor will have to ensure that your baby is healthy and growing well. This information is not intended to replace advice given to you by your health care provider. Make sure you discuss any questions you have with your health care provider. Document Revised: 07/31/2018 Document Reviewed: 04/17/2016 Elsevier Patient Education  2020 Elsevier Inc. Common Medications Safe in Pregnancy  Acne:      Constipation:  Benzoyl Peroxide     Colace  Clindamycin      Dulcolax Suppository  Topica Erythromycin     Fibercon  Salicylic  Acid      Metamucil         Miralax AVOID:        Senakot   Accutane    Cough:  Retin-A       Cough Drops  Tetracycline      Phenergan w/ Codeine if Rx  Minocycline      Robitussin (Plain & DM)  Antibiotics:     Crabs/Lice:  Ceclor       RID  Cephalosporins    AVOID:  E-Mycins      Kwell  Keflex  Macrobid/Macrodantin   Diarrhea:  Penicillin      Kao-Pectate  Zithromax      Imodium AD         PUSH FLUIDS AVOID:         PUSH FLUIDS AVOID:       Cipro     Fever:  Tetracycline      Tylenol (Regular or Extra  Minocycline       Strength)  Levaquin      Extra Strength-Do not          Exceed 8 tabs/24 hrs Caffeine:        <227m/day (equiv. To 1 cup of coffee or  approx. 3 12 oz sodas)         Gas: Cold/Hayfever:       Gas-X  Benadryl      Mylicon  Claritin       Phazyme  **Claritin-D        Chlor-Trimeton    Headaches:  Dimetapp      ASA-Free Excedrin  Drixoral-Non-Drowsy     Cold Compress  Mucinex (Guaifenasin)     Tylenol (Regular or Extra  Sudafed/Sudafed-12 Hour     Strength)  **Sudafed PE Pseudoephedrine   Tylenol Cold & Sinus     Vicks Vapor Rub  Zyrtec  **AVOID if Problems With Blood Pressure         Heartburn: Avoid lying down for at least 1 hour after meals  Aciphex      Maalox     Rash:  Milk of Magnesia     Benadryl    Mylanta       1% Hydrocortisone Cream  Pepcid  Pepcid Complete   Sleep Aids:  Prevacid      Ambien   Prilosec       Benadryl  Rolaids       Chamomile Tea  Tums (Limit 4/day)     Unisom  Zantac       Tylenol PM         Warm milk-add vanilla or  Hemorrhoids:       Sugar for taste  Anusol/Anusol H.C.  (RX: Analapram 2.5%)  Sugar Substitutes:  Hydrocortisone OTC     Ok in moderation  Preparation H      Tucks        Vaseline lotion applied to tissue with wiping    Herpes:     Throat:  Acyclovir      Oragel  Famvir  Valtrex     Vaccines:         Flu Shot Leg Cramps:       *Gardasil  Benadryl      Hepatitis A         Hepatitis B Nasal  Spray:       Pneumovax  Saline Nasal Spray     Polio Booster         Tetanus Nausea:       Tuberculosis test or PPD  Vitamin B6 25 mg TID   AVOID:    Dramamine      *Gardasil  Emetrol       Live Poliovirus  Ginger Root 250 mg QID    MMR (measles, mumps &  High Complex Carbs @ Bedtime    rebella)  Sea Bands-Accupressure    Varicella (Chickenpox)  Unisom 1/2 tab TID     *No known complications           If received before Pain:         Known pregnancy;   Darvocet       Resume series after  Lortab        Delivery  Percocet    Yeast:   Tramadol  Femstat  Tylenol 3      Gyne-lotrimin  Ultram       Monistat  Vicodin           MISC:         All Sunscreens           Hair Coloring/highlights          Insect Repellant's          (Including DEET)         Mystic Tans  

## 2019-08-11 LAB — HGB SOLU + RFLX FRAC: Sickle Solubility Test - HGBRFX: NEGATIVE

## 2019-08-11 LAB — URINALYSIS, ROUTINE W REFLEX MICROSCOPIC
Bilirubin, UA: NEGATIVE
Glucose, UA: NEGATIVE
Nitrite, UA: NEGATIVE
RBC, UA: NEGATIVE
Specific Gravity, UA: 1.028 (ref 1.005–1.030)
Urobilinogen, Ur: 1 mg/dL (ref 0.2–1.0)
pH, UA: 6 (ref 5.0–7.5)

## 2019-08-11 LAB — TOXOPLASMA ANTIBODIES- IGG AND  IGM
Toxoplasma Antibody- IgM: <3 [AU]/ml (ref 0.0–7.9)
Toxoplasma IgG Ratio: <3 [IU]/mL (ref 0.0–7.1)

## 2019-08-11 LAB — MICROSCOPIC EXAMINATION: Casts: NONE SEEN /lpf

## 2019-08-11 LAB — SYPHILIS: RPR W/REFLEX TO RPR TITER AND TREPONEMAL ANTIBODIES, TRADITIONAL SCREENING AND DIAGNOSIS ALGORITHM: RPR Ser Ql: NONREACTIVE

## 2019-08-11 LAB — RUBELLA SCREEN: Rubella Antibodies, IGG: 2.04 index (ref >0.99)

## 2019-08-11 LAB — ANTIBODY SCREEN: Antibody Screen: NEGATIVE

## 2019-08-11 LAB — HEPATITIS B SURFACE ANTIGEN: Hepatitis B Surface Ag: NEGATIVE

## 2019-08-11 LAB — ABO AND RH: Rh Factor: POSITIVE

## 2019-08-11 LAB — HIV ANTIBODY (ROUTINE TESTING W REFLEX): HIV Screen 4th Generation wRfx: NONREACTIVE

## 2019-08-11 LAB — VARICELLA ZOSTER ANTIBODY, IGG: Varicella zoster IgG: 269 index (ref >165)

## 2019-08-14 LAB — URINE CULTURE, OB REFLEX

## 2019-08-14 LAB — CULTURE, OB URINE

## 2019-08-17 ENCOUNTER — Other Ambulatory Visit: Payer: Self-pay | Admitting: Certified Nurse Midwife

## 2019-08-17 MED ORDER — NITROFURANTOIN MONOHYD MACRO 100 MG PO CAPS: 100.0000 mg | ORAL_CAPSULE | Freq: Two times a day (BID) | ORAL | 0 refills | Status: AC

## 2019-08-17 NOTE — Progress Notes (Signed)
Urine positive for uti, orders placed for treatment.   Doreene Burke, CNM

## 2019-08-24 ENCOUNTER — Ambulatory Visit (INDEPENDENT_AMBULATORY_CARE_PROVIDER_SITE_OTHER): Payer: 59 | Admitting: Certified Nurse Midwife

## 2019-08-24 ENCOUNTER — Other Ambulatory Visit: Payer: Self-pay

## 2019-08-24 ENCOUNTER — Telehealth: Payer: Self-pay

## 2019-08-24 ENCOUNTER — Encounter: Payer: Self-pay | Admitting: Certified Nurse Midwife

## 2019-08-24 VITALS — BP 125/94 | HR 78 | Wt 150.2 lb

## 2019-08-24 DIAGNOSIS — Z3A12 12 weeks gestation of pregnancy: Secondary | ICD-10-CM

## 2019-08-24 DIAGNOSIS — Z3481 Encounter for supervision of other normal pregnancy, first trimester: Secondary | ICD-10-CM

## 2019-08-24 LAB — POCT URINALYSIS DIPSTICK OB
Bilirubin, UA: NEGATIVE
Blood, UA: NEGATIVE
Glucose, UA: NEGATIVE
Ketones, UA: NEGATIVE
Leukocytes, UA: NEGATIVE
Nitrite, UA: NEGATIVE
POC,PROTEIN,UA: NEGATIVE
Spec Grav, UA: 1.01 (ref 1.010–1.025)
Urobilinogen, UA: 0.2 E.U./dL
pH, UA: 5 (ref 5.0–8.0)

## 2019-08-24 MED ORDER — PROMETHAZINE HCL 12.5 MG RE SUPP: 12.5000 mg | Freq: Four times a day (QID) | RECTAL | 0 refills | Status: DC | PRN

## 2019-08-24 MED ORDER — NIFEDIPINE ER OSMOTIC RELEASE 30 MG PO TB24: 30.0000 mg | ORAL_TABLET | Freq: Every day | ORAL | 0 refills | Status: DC

## 2019-08-24 NOTE — Progress Notes (Signed)
NEW OB HISTORY AND PHYSICAL  SUBJECTIVE:       Connie Webster is a 39 y.o. [phone] female, Patient's last menstrual period was 06/02/2019 (exact date)., Estimated Date of Delivery: 03/08/20, [redacted]w[redacted]d, presents today for establishment of Prenatal Care. She has no unusual complaints and complains of nausea with vomiting. Hx signifigant for chronic hypertension on labetalol 100 mg BID, has not taken since Tuesday due to N&V. States it makes her sleepy. Also hx GDM last pregnancy diet controlled.    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 Normal Contraception: none Last Pap: 09/11/16. Results were: normal  Obstetric [facility]ie [facility] OB History  Gravida Para Term Preterm AB Living  4 2 2  0 1 2  SAB TAB Ectopic Multiple Live Births  1 0 0 0 2    # Outcome Date GA Lbr Len/2nd Weight Sex Delivery Anes PTL Lv  4 Current           3 Term 04/18/17 [redacted]w[redacted]d / 02:05 6 lb 6.5 oz (2.905 kg) F Vag-Spont EPI  LIV  2 Term 06/12/07 [redacted]w[redacted]d  7 lb 2 oz (3.232 kg) F Vag-Spont   LIV  1 SAB 2007 [redacted]w[redacted]d           Past Medical History:  Diagnosis Date  . Anxiety   . Asthma    frequently uses inhaler  . Depression    takes meds  . Elevated blood pressure affecting pregnancy, antepartum 03/06/2017  . Gestational diabetes    diet controlled  . Gestational diabetes   . Hypertension    gestational   . Umbilical hernia     Past Surgical History:  Procedure Laterality Date  . DILATION AND CURETTAGE OF UTERUS      Current Outpatient Medications on File Prior to Visit  Medication Sig Dispense Refill  . acetaminophen (TYLENOL) 500 MG tablet Take 500 mg by mouth daily as needed for moderate pain or headache.    . albuterol (PROVENTIL HFA;VENTOLIN HFA) 108 (90 BASE) MCG/ACT inhaler Inhale 2 puffs into the  lungs every 4 (four) hours as needed for wheezing or shortness of breath.    Marland Kitchen albuterol (VENTOLIN HFA) 108 (90 Base) MCG/ACT inhaler Inhale into the lungs.    Marland Kitchen doxylamine, Sleep, (UNISOM) 25 MG tablet Take 25 mg by mouth at bedtime as needed for sleep.    . nitrofurantoin, macrocrystal-monohydrate, (MACROBID) 100 MG capsule Take 1 capsule (100 mg total) by mouth 2 (two) times daily for 7 days. 14 capsule 0  . ondansetron (ZOFRAN ODT) 4 MG disintegrating tablet Take 1 tablet (4 mg total) by mouth every 6 (six) hours as needed for nausea. 20 tablet 2  . ondansetron (ZOFRAN-ODT) 4 MG disintegrating tablet Take by mouth.    . venlafaxine XR (EFFEXOR-XR) 75 MG 24 hr capsule Take by mouth.     No current facility-administered medications on file prior to visit.    Allergies  Allergen Reactions  . Peanut-Containing Drug Products Anaphylaxis    Social History   Socioeconomic History  . Marital status: Married    Spouse name: Not on file  . Number of children: 1  . Years of education: Not on file  . Highest education level: Not on file  Occupational History  . Not on file  Tobacco Use  . Smoking status: Never Smoker  .  Smokeless tobacco: Never Used  Substance and Sexual Activity  . Alcohol use: No  . Drug use: No  . Sexual activity: Yes    Birth control/protection: None  Other Topics Concern  . Not on file  Social History Narrative  . Not on file   Social Determinants of Health   Financial Resource Strain:   . Difficulty of Paying Living Expenses:   Food Insecurity:   . Worried About Programme researcher, broadcasting/film/video in the Last Year:   . Barista in the Last Year:   Transportation Needs:   . Freight forwarder (Medical):   Marland Kitchen Lack of Transportation (Non-Medical):   Physical Activity:   . Days of Exercise per Week:   . Minutes of Exercise per Session:   Stress:   . Feeling of Stress :   Social Connections:   . Frequency of Communication with Friends and Family:   .  Frequency of Social Gatherings with Friends and Family:   . Attends Religious Services:   . Active Member of Clubs or Organizations:   . Attends Banker Meetings:   Marland Kitchen Marital Status:   Intimate Partner Violence:   . Fear of Current or Ex-Partner:   . Emotionally Abused:   Marland Kitchen Physically Abused:   . Sexually Abused:     Family History  Problem Relation Age of Onset  . Epilepsy Father   . Epilepsy Brother     The following portions of the patient's history were reviewed and updated as appropriate: allergies, current medications, past OB history, past medical history, past surgical history, past family history, past social history, and problem list.    OBJECTIVE: Initial Physical Exam (New OB)  GENERAL APPEARANCE: alert, well appearing, in no apparent distress, oriented to person, place and time HEAD: normocephalic, atraumatic MOUTH: mucous membranes moist, pharynx normal without lesions THYROID: no thyromegaly or masses present BREASTS: no masses noted, no significant tenderness, no palpable axillary nodes, no skin changes LUNGS: clear to auscultation, no wheezes, rales or rhonchi, symmetric air entry HEART: regular rate and rhythm, no murmurs ABDOMEN: soft, nontender, nondistended, no abnormal masses, no epigastric pain, fundus soft, nontender 12 weeks size and FHT present EXTREMITIES: no redness or tenderness in the calves or thighs SKIN: normal coloration and turgor, no rashes LYMPH NODES: no adenopathy palpable NEUROLOGIC: alert, oriented, normal speech, no focal findings or movement disorder noted  PELVIC EXAM EXTERNAL GENITALIA: normal appearing vulva with no masses, tenderness or lesions VAGINA: no abnormal discharge or lesions CERVIX: no lesions or cervical motion tenderness UTERUS: gravid ADNEXA: no masses palpable and nontender OB EXAM PELVIMETRY: appears adequate RECTUM: exam not indicated  ASSESSMENT: Normal pregnancy  PLAN: New OB counseling:  The patient has been given an overview regarding routine prenatal care. Recommendations regarding diet, weight gain, and exercise in pregnancy were given. Prenatal testing, optional genetic testing, and ultrasound use in pregnancy were reviewed.  Benefits of Breast Feeding were discussed. The patient is encouraged to consider nursing her baby post partum. She declines genetic testing. Switching labetolol to procardia 30 mg once daily due to unwanted side effects ( sleepy). Discussed use of effexor in pregnancy potential risks  she verbalizes understanding. Follow up BP check Wednesday or Thursday .   Doreene Burke, CNM

## 2019-08-24 NOTE — Telephone Encounter (Signed)
-----   Message from Doreene Burke, [place] sent at 08/24/2019 10:10 AM EDT ----- Connie Webster,   Please call Landry Mellow, tell her we are switching her BP medication to procarida. Please have her pick it up and take it once a day. I would like her to come back to the office on Wed or Thursday for BP check.   Thanks  Pattricia Boss

## 2019-08-24 NOTE — Patient Instructions (Signed)

## 2019-08-24 NOTE — Telephone Encounter (Signed)
Patient informed of Connie Webster message. Appointment made 08/26/19 for a repeat BP check.

## 2019-08-25 LAB — CBC
Hematocrit: 41.1 % (ref 34.0–46.6)
Hemoglobin: 13.9 g/dL (ref 11.1–15.9)
MCH: 31.2 pg (ref 26.6–33.0)
MCHC: 33.8 g/dL (ref 31.5–35.7)
MCV: 92 fL (ref 79–97)
Platelets: 312 10*3/uL (ref 150–450)
RBC: 4.46 x10E6/uL (ref 3.77–5.28)
RDW: 13.1 % (ref 11.7–15.4)
WBC: 8.9 10*3/uL (ref 3.4–10.8)

## 2019-08-26 ENCOUNTER — Other Ambulatory Visit: Payer: Self-pay

## 2019-08-26 ENCOUNTER — Ambulatory Visit: Payer: 59 | Admitting: Certified Nurse Midwife

## 2019-08-26 VITALS — BP 113/87 | HR 78 | Ht 63.75 in | Wt 152.2 lb

## 2019-08-26 DIAGNOSIS — Z0130 Encounter for examination of blood pressure without abnormal findings: Secondary | ICD-10-CM

## 2019-08-26 LAB — URINE CULTURE

## 2019-08-26 NOTE — Progress Notes (Signed)
Patient presents to office for a BP check. Patient states she has a sphygmomanometer at home and she will take her BP and send the numbers through mychart.

## 2019-09-16 ENCOUNTER — Telehealth: Payer: Self-pay | Admitting: Certified Nurse Midwife

## 2019-09-16 NOTE — Telephone Encounter (Signed)
Pt called in and stated she needs to change her dates for her FMLA to 02/26/2020 thur 05/30/2020. Please advise

## 2019-09-25 ENCOUNTER — Ambulatory Visit (INDEPENDENT_AMBULATORY_CARE_PROVIDER_SITE_OTHER): Payer: 59 | Admitting: Certified Nurse Midwife

## 2019-09-25 ENCOUNTER — Other Ambulatory Visit: Payer: Self-pay

## 2019-09-25 ENCOUNTER — Encounter: Payer: Self-pay | Admitting: Certified Nurse Midwife

## 2019-09-25 VITALS — BP 125/69 | HR 86 | Wt 151.6 lb

## 2019-09-25 DIAGNOSIS — O99340 Other mental disorders complicating pregnancy, unspecified trimester: Secondary | ICD-10-CM | POA: Insufficient documentation

## 2019-09-25 DIAGNOSIS — Z3A16 16 weeks gestation of pregnancy: Secondary | ICD-10-CM

## 2019-09-25 DIAGNOSIS — Z6826 Body mass index (BMI) 26.0-26.9, adult: Secondary | ICD-10-CM

## 2019-09-25 DIAGNOSIS — R030 Elevated blood-pressure reading, without diagnosis of hypertension: Secondary | ICD-10-CM

## 2019-09-25 DIAGNOSIS — Z8632 Personal history of gestational diabetes: Secondary | ICD-10-CM

## 2019-09-25 DIAGNOSIS — Z3482 Encounter for supervision of other normal pregnancy, second trimester: Secondary | ICD-10-CM

## 2019-09-25 DIAGNOSIS — F419 Anxiety disorder, unspecified: Secondary | ICD-10-CM | POA: Insufficient documentation

## 2019-09-25 DIAGNOSIS — Z87440 Personal history of urinary (tract) infections: Secondary | ICD-10-CM

## 2019-09-25 DIAGNOSIS — O26812 Pregnancy related exhaustion and fatigue, second trimester: Secondary | ICD-10-CM

## 2019-09-25 LAB — POCT URINALYSIS DIPSTICK OB
Bilirubin, UA: NEGATIVE
Blood, UA: NEGATIVE
Glucose, UA: NEGATIVE
Ketones, UA: NEGATIVE
Leukocytes, UA: NEGATIVE
Nitrite, UA: NEGATIVE
POC,PROTEIN,UA: NEGATIVE
Spec Grav, UA: 1.02 (ref 1.010–1.025)
Urobilinogen, UA: 0.2 E.U./dL
pH, UA: 5 (ref 5.0–8.0)

## 2019-09-25 MED ORDER — NIFEDIPINE ER OSMOTIC RELEASE 30 MG PO TB24: 30.0000 mg | ORAL_TABLET | Freq: Every day | ORAL | 5 refills | Status: DC

## 2019-09-25 MED ORDER — ONDANSETRON 4 MG PO TBDP: 4.0000 mg | ORAL_TABLET | Freq: Four times a day (QID) | ORAL | 2 refills | Status: DC | PRN

## 2019-09-25 MED ORDER — ASPIRIN EC 81 MG PO TBEC
81.0000 mg | DELAYED_RELEASE_TABLET | Freq: Every day | ORAL | 2 refills | Status: DC
Start: 2019-09-25 — End: 2020-02-22

## 2019-09-25 NOTE — Telephone Encounter (Signed)
zofran refill sent per patient request

## 2019-09-25 NOTE — Patient Instructions (Signed)

## 2019-09-25 NOTE — Progress Notes (Addendum)
ROB- Doing well. Reports fatigue and feels tired all the time. Nausea has improved only taking Zofran as needed. Home blood pressure readings WNL, taking Nifedipine daily. Advised 81 mg Aspirin daily, see orders. Discussed twenty (20) week visit and anatomy scan. Does not want to know gender. A1c and urine culture today, see orders. Waterbirth handouts provided. Anticipatory guidance given. Reviewed red flag symptoms and when to call the office.  RTC x 4 weeks for ROB and ANATOMY Scan or sooner if needed.  Glorious Peach RN [facility] [employer] 09/25/19 10:55 AM

## 2019-09-25 NOTE — Progress Notes (Signed)
I have seen, interviewed, and examined the patient in conjunction with the [employer] and affirm the diagnosis and management plan.   Gunnar Bulla, CNM [facility], [facility] 09/25/19 3:19 PM

## 2019-09-26 LAB — CBC
Hematocrit: 41.2 % (ref 34.0–46.6)
Hemoglobin: 13.6 g/dL (ref 11.1–15.9)
MCH: 30.8 pg (ref 26.6–33.0)
MCHC: 33 g/dL (ref 31.5–35.7)
MCV: 93 fL (ref 79–97)
Platelets: 311 10*3/uL (ref 150–450)
RBC: 4.42 x10E6/uL (ref 3.77–5.28)
RDW: 12.9 % (ref 11.7–15.4)
WBC: 9.6 10*3/uL (ref 3.4–10.8)

## 2019-09-26 LAB — TSH: TSH: 0.801 u[IU]/mL (ref 0.450–4.500)

## 2019-09-26 LAB — VITAMIN D 25 HYDROXY (VIT D DEFICIENCY, FRACTURES): Vit D, 25-Hydroxy: 25.6 ng/mL — ABNORMAL LOW (ref 30.0–100.0)

## 2019-09-26 LAB — HEMOGLOBIN A1C
Est. average glucose Bld gHb Est-mCnc: 111 mg/dL
Hgb A1c MFr Bld: 5.5 % (ref 4.8–5.6)

## 2019-09-27 ENCOUNTER — Encounter: Payer: Self-pay | Admitting: Certified Nurse Midwife

## 2019-09-27 ENCOUNTER — Other Ambulatory Visit (INDEPENDENT_AMBULATORY_CARE_PROVIDER_SITE_OTHER): Payer: 59 | Admitting: Certified Nurse Midwife

## 2019-09-27 DIAGNOSIS — E559 Vitamin D deficiency, unspecified: Secondary | ICD-10-CM

## 2019-09-27 LAB — URINE CULTURE: Organism ID, Bacteria: NO GROWTH

## 2019-09-27 MED ORDER — VITAMIN D (ERGOCALCIFEROL) 1.25 MG (50000 UNIT) PO CAPS: 50000.0000 [IU] | ORAL_CAPSULE | ORAL | 1 refills | Status: DC

## 2019-09-27 NOTE — Progress Notes (Signed)
Rx Vitamin D, see orders.    Gunnar Bulla, CNM [facility], [facility] 09/27/19 7:20 PM

## 2019-10-20 ENCOUNTER — Other Ambulatory Visit: Payer: Self-pay | Admitting: Certified Nurse Midwife

## 2019-10-20 DIAGNOSIS — Z3492 Encounter for supervision of normal pregnancy, unspecified, second trimester: Secondary | ICD-10-CM

## 2019-10-21 ENCOUNTER — Other Ambulatory Visit: Payer: Self-pay

## 2019-10-21 ENCOUNTER — Ambulatory Visit (INDEPENDENT_AMBULATORY_CARE_PROVIDER_SITE_OTHER): Payer: 59 | Admitting: Certified Nurse Midwife

## 2019-10-21 ENCOUNTER — Ambulatory Visit (INDEPENDENT_AMBULATORY_CARE_PROVIDER_SITE_OTHER): Payer: 59

## 2019-10-21 VITALS — BP 108/68 | HR 82 | Wt 154.1 lb

## 2019-10-21 DIAGNOSIS — Z3492 Encounter for supervision of normal pregnancy, unspecified, second trimester: Secondary | ICD-10-CM | POA: Diagnosis not present

## 2019-10-21 DIAGNOSIS — Z3A20 20 weeks gestation of pregnancy: Secondary | ICD-10-CM

## 2019-10-21 LAB — POCT URINALYSIS DIPSTICK OB
Bilirubin, UA: NEGATIVE
Blood, UA: NEGATIVE
Glucose, UA: NEGATIVE
Ketones, UA: NEGATIVE
Leukocytes, UA: NEGATIVE
Nitrite, UA: NEGATIVE
POC,PROTEIN,UA: NEGATIVE
Spec Grav, UA: 1.01 (ref 1.010–1.025)
Urobilinogen, UA: 0.2 E.U./dL
pH, UA: 5 (ref 5.0–8.0)

## 2019-10-21 NOTE — Progress Notes (Signed)
ROB doing well. Anantomy scan today ( see below) results reviewed. Discussed water birth, class completed. Certificate copied into chart. 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 Discussed doula she veralizes understanding. Follow up 4 wks with Marcelino Duster.   Doreene Burke, CNM   Patient Name: Connie Webster DOB: [birth year] MRN: [id number] ULTRASOUND REPORT  Location: [facility] Date of Service: 10/21/2019   Indications:Anatomy Ultrasound Findings:  Mason Jim intrauterine pregnancy is visualized with FHR at 148 BPM. Biometrics give an (U/S) Gestational age of [redacted]w[redacted]d and an (U/S) EDD of 03/16/2020; this correlates with the clinically established Estimated Date of Delivery: 03/08/20  Fetal presentation is transverse.  EFW: 262 g ( 9 oz). Fetal Percentile  Placenta: anterior. Grade: 1 AFI: subjectively normal.  Anatomic survey is complete and normal; Gender - surprise.    Right Ovary is normal in appearance. Left Ovary is normal appearance. Survey of the adnexa demonstrates no adnexal masses. There is no free peritoneal fluid in the cul de sac.  Impression: 1. [redacted]w[redacted]d Viable Singleton Intrauterine pregnancy by U/S. 2. (U/S) EDD is consistent with Clinically established Estimated Date of Delivery: 03/08/20 . 3. Normal Anatomy Scan  Recommendations: 1.Clinical correlation with the patient's History and Physical Exam.   Jenine M. Marciano Sequin    RDMS

## 2019-10-21 NOTE — Patient Instructions (Signed)

## 2019-11-18 NOTE — Progress Notes (Signed)
ROB-Pt present for routine prenatal care. Patient feels that she may have a hemorrhoid.

## 2019-11-18 NOTE — Patient Instructions (Signed)
Common Medications Safe in Pregnancy  Acne:      Constipation:  Benzoyl Peroxide     Colace  Clindamycin      Dulcolax Suppository  Topica Erythromycin     Fibercon  Salicylic Acid      Metamucil         Miralax AVOID:        Senakot   Accutane    Cough:  Retin-A       Cough Drops  Tetracycline      Phenergan w/ Codeine if Rx  Minocycline      Robitussin (Plain & DM)  Antibiotics:     Crabs/Lice:  Ceclor       RID  Cephalosporins    AVOID:  E-Mycins      Kwell  Keflex  Macrobid/Macrodantin   Diarrhea:  Penicillin      Kao-Pectate  Zithromax      Imodium AD         PUSH FLUIDS AVOID:       Cipro     Fever:  Tetracycline      Tylenol (Regular or Extra  Minocycline       Strength)  Levaquin      Extra Strength-Do not          Exceed 8 tabs/24 hrs Caffeine:        <247m/day (equiv. To 1 cup of coffee or  approx. 3 12 oz sodas)         Gas: Cold/Hayfever:       Gas-X  Benadryl      Mylicon  Claritin       Phazyme  **Claritin-D        Chlor-Trimeton    Headaches:  Dimetapp      ASA-Free Excedrin  Drixoral-Non-Drowsy     Cold Compress  Mucinex (Guaifenasin)     Tylenol (Regular or Extra  Sudafed/Sudafed-12 Hour     Strength)  **Sudafed PE Pseudoephedrine   Tylenol Cold & Sinus     Vicks Vapor Rub  Zyrtec  **AVOID if Problems With Blood Pressure         Heartburn: Avoid lying down for at least 1 hour after meals  Aciphex      Maalox     Rash:  Milk of Magnesia     Benadryl    Mylanta       1% Hydrocortisone Cream  Pepcid  Pepcid Complete   Sleep Aids:  Prevacid      Ambien   Prilosec       Benadryl  Rolaids       Chamomile Tea  Tums (Limit 4/day)     Unisom                    Tylenol PM         Warm milk-add vanilla or  Hemorrhoids:       Sugar for taste  Anusol/Anusol H.C.  (RX: Analapram 2.5%)  Sugar Substitutes:  Hydrocortisone OTC     Ok in moderation  Preparation H      Tucks        Vaseline lotion applied to tissue with  wiping    Herpes:     Throat:  Acyclovir      Oragel  Famvir  Valtrex     Vaccines:         Flu Shot Leg Cramps:       *Gardasil  Benadryl      Hepatitis A  Pneumovax  Saline Nasal Spray     Polio Booster         Tetanus Nausea:       Tuberculosis test or PPD  Vitamin B6 25 mg TID   AVOID:    Dramamine      *Gardasil  Emetrol       Live Poliovirus  Ginger Root 250 mg QID    MMR (measles, mumps &  High Complex Carbs @ Bedtime    rebella)  Sea Bands-Accupressure    Varicella (Chickenpox)  Unisom 1/2 tab TID     *No known complications           If received before Pain:         Known pregnancy;   Darvocet       Resume series after  Lortab        Delivery  Percocet    Yeast:   Tramadol      Femstat  Tylenol 3      Gyne-lotrimin  Ultram       Monistat  Vicodin           MISC:         All Sunscreens           Hair Coloring/highlights          Insect Repellant's          (Including DEET)         Mystic Tans Breastfeeding  Choosing to breastfeed is one of the best decisions you can make for yourself and your baby. A change in hormones during pregnancy causes your breasts to make breast milk in your milk-producing glands. Hormones prevent breast milk from being released before your baby is born. They also prompt milk flow after birth. Once breastfeeding has begun, thoughts of your baby, as well as his or her sucking or crying, can stimulate the release of milk from your milk-producing glands. Benefits of breastfeeding Research shows that breastfeeding offers many health benefits for infants and mothers. It also offers a cost-free and convenient way to feed your baby. For your baby  Your first milk (colostrum) helps your baby's digestive system to function better.  Special cells in your milk (antibodies) help your baby to fight off infections.  Breastfed babies are less likely to develop asthma, allergies, obesity, or type 2 diabetes. They  are also at lower risk for sudden infant death syndrome (SIDS).  Nutrients in breast milk are better able to meet your baby's needs compared to infant formula.  Breast milk improves your baby's brain development. For you  Breastfeeding helps to create a very special bond between you and your baby.  Breastfeeding is convenient. Breast milk costs nothing and is always available at the correct temperature.  Breastfeeding helps to burn calories. It helps you to lose the weight that you gained during pregnancy.  Breastfeeding makes your uterus return faster to its size before pregnancy. It also slows bleeding (lochia) after you give birth.  Breastfeeding helps to lower your risk of developing type 2 diabetes, osteoporosis, rheumatoid arthritis, cardiovascular disease, and breast, ovarian, uterine, and endometrial cancer later in life. Breastfeeding basics Starting breastfeeding  Find a comfortable place to sit or lie down, with your neck and back well-supported.  Place a pillow or a rolled-up blanket under your baby to bring him or her to the level of your breast (if you are seated). Nursing pillows are specially designed to help support your arms and your baby while you   to help support your arms and your baby while you breastfeed.  Make sure that your baby's tummy (abdomen) is facing your abdomen.  Gently massage your breast. With your fingertips, massage from the outer edges of your breast inward toward the nipple. This encourages milk flow. If your milk flows slowly, you may need to continue this action during the feeding.  Support your breast with 4 fingers underneath and your thumb above your nipple (make the letter "C" with your hand). Make sure your fingers are well away from your nipple and your babys mouth.  Stroke your baby's lips gently with your finger or nipple.  When your baby's mouth is open wide enough, quickly bring your baby to your breast, placing your entire nipple and as much of the areola as possible into your baby's  mouth. The areola is the colored area around your nipple. ? More areola should be visible above your baby's upper lip than below the lower lip. ? Your baby's lips should be opened and extended outward (flanged) to ensure an adequate, comfortable latch. ? Your baby's tongue should be between his or her lower gum and your breast.  Make sure that your baby's mouth is correctly positioned around your nipple (latched). Your baby's lips should create a seal on your breast and be turned out (everted).  It is common for your baby to suck about 2-3 minutes in order to start the flow of breast milk. Latching Teaching your baby how to latch onto your breast properly is very important. An improper latch can cause nipple pain, decreased milk supply, and poor weight gain in your baby. Also, if your baby is not latched onto your nipple properly, he or she may swallow some air during feeding. This can make your baby fussy. Burping your baby when you switch breasts during the feeding can help to get rid of the air. However, teaching your baby to latch on properly is still the best way to prevent fussiness from swallowing air while breastfeeding. Signs that your baby has successfully latched onto your nipple  Silent tugging or silent sucking, without causing you pain. Infant's lips should be extended outward (flanged).  Swallowing heard between every 3-4 sucks once your milk has started to flow (after your let-down milk reflex occurs).  Muscle movement above and in front of his or her ears while sucking. Signs that your baby has not successfully latched onto your nipple  Sucking sounds or smacking sounds from your baby while breastfeeding.  Nipple pain. If you think your baby has not latched on correctly, slip your finger into the corner of your babys mouth to break the suction and place it between your baby's gums. Attempt to start breastfeeding again. Signs of successful breastfeeding Signs from your  baby  Your baby will gradually decrease the number of sucks or will completely stop sucking.  Your baby will fall asleep.  Your baby's body will relax.  Your baby will retain a small amount of milk in his or her mouth.  Your baby will let go of your breast by himself or herself. Signs from you  Breasts that have increased in firmness, weight, and size 1-3 hours after feeding.  Breasts that are softer immediately after breastfeeding.  Increased milk volume, as well as a change in milk consistency and color by the fifth day of breastfeeding.  Nipples that are not sore, cracked, or bleeding. Signs that your baby is getting enough milk  Wetting at least 1-2 diapers during the  first 24 hours after birth.  Wetting at least 5-6 diapers every 24 hours for the first week after birth. The urine should be clear or pale yellow by the age of 5 days.  Wetting 6-8 diapers every 24 hours as your baby continues to grow and develop.  At least 3 stools in a 24-hour period by the age of 5 days. The stool should be soft and yellow.  At least 3 stools in a 24-hour period by the age of 7 days. The stool should be seedy and yellow.  No loss of weight greater than 10% of birth weight during the first 3 days of life.  Average weight gain of 4-7 oz (113-198 g) per week after the age of 4 days.  Consistent daily weight gain by the age of 5 days, without weight loss after the age of 2 weeks. After a feeding, your baby may spit up a small amount of milk. This is normal. Breastfeeding frequency and duration Frequent feeding will help you make more milk and can prevent sore nipples and extremely full breasts (breast engorgement). Breastfeed when you feel the need to reduce the fullness of your breasts or when your baby shows signs of hunger. This is called "breastfeeding on demand." Signs that your baby is hungry include:  Increased alertness, activity, or restlessness.  Movement of the head from side to  side.  Opening of the mouth when the corner of the mouth or cheek is stroked (rooting).  Increased sucking sounds, smacking lips, cooing, sighing, or squeaking.  Hand-to-mouth movements and sucking on fingers or hands.  Fussing or crying. Avoid introducing a pacifier to your baby in the first 4-6 weeks after your baby is born. After this time, you may choose to use a pacifier. Research has shown that pacifier use during the first year of a baby's life decreases the risk of sudden infant death syndrome (SIDS). Allow your baby to feed on each breast as long as he or she wants. When your baby unlatches or falls asleep while feeding from the first breast, offer the second breast. Because newborns are often sleepy in the first few weeks of life, you may need to awaken your baby to get him or her to feed. Breastfeeding times will vary from baby to baby. However, the following rules can serve as a guide to help you make sure that your baby is properly fed:  Newborns (babies 61 weeks of age or younger) may breastfeed every 1-3 hours.  Newborns should not go without breastfeeding for longer than 3 hours during the day or 5 hours during the night.  You should breastfeed your baby a minimum of 8 times in a 24-hour period. Breast milk pumping     Pumping and storing breast milk allows you to make sure that your baby is exclusively fed your breast milk, even at times when you are unable to breastfeed. This is especially important if you go back to work while you are still breastfeeding, or if you are not able to be present during feedings. Your lactation consultant can help you find a method of pumping that works best for you and give you guidelines about how long it is safe to store breast milk. Caring for your breasts while you breastfeed Nipples can become dry, cracked, and sore while breastfeeding. The following recommendations can help keep your breasts moisturized and healthy:  Avoid using soap on  your nipples.  Wear a supportive bra designed especially for nursing. Avoid wearing underwire-style  nipples for 3-4 minutes after each feeding.  Use only cotton bra pads to absorb leaked breast milk. Leaking of breast milk between feedings is normal.  Use lanolin on your nipples after breastfeeding. Lanolin helps to maintain your skin's normal moisture barrier. Pure lanolin is not harmful (not toxic) to your baby. You may also hand express a few drops of breast milk and gently massage that milk into your nipples and allow the milk to air-dry. In the first few weeks after giving birth, some women experience breast engorgement. Engorgement can make your breasts feel heavy, warm, and tender to the touch. Engorgement peaks within 3-5 days after you give birth. The following recommendations can help to ease engorgement:  Completely empty your breasts while breastfeeding or pumping. You may want to start by applying warm, moist heat (in the shower or with warm, water-soaked hand towels) just before feeding or pumping. This increases circulation and helps the milk flow. If your baby does not completely empty your breasts while breastfeeding, pump any extra milk after he or she is finished.  Apply ice packs to your breasts immediately after breastfeeding or pumping, unless this is too uncomfortable for you. To do this: ? Put ice in a plastic bag. ? Place a towel between your skin and the bag. ? Leave the ice on for 20 minutes, 2-3 times a day.  Make sure that your baby is latched on and positioned properly while breastfeeding. If engorgement persists after 48 hours of following these recommendations, contact your health care provider or a lactation consultant. Overall health care recommendations while breastfeeding  Eat 3 healthy meals and 3 snacks every day. Well-nourished mothers who are breastfeeding need an additional 450-500 calories a day.  You can meet this requirement by increasing the amount of a balanced diet that you eat.  Drink enough water to keep your urine pale yellow or clear.  Rest often, relax, and continue to take your prenatal vitamins to prevent fatigue, stress, and low vitamin and mineral levels in your body (nutrient deficiencies).  Do not use any products that contain nicotine or tobacco, such as cigarettes and e-cigarettes. Your baby may be harmed by chemicals from cigarettes that pass into breast milk and exposure to secondhand smoke. If you need help quitting, ask your health care provider.  Avoid alcohol.  Do not use illegal drugs or marijuana.  Talk with your health care provider before taking any medicines. These include over-the-counter and prescription medicines as well as vitamins and herbal supplements. Some medicines that may be harmful to your baby can pass through breast milk.  It is possible to become pregnant while breastfeeding. If birth control is desired, ask your health care provider about options that will be safe while breastfeeding your baby. Where to find more information: [employer]: www.llli.org Contact a health care provider if:  You feel like you want to stop breastfeeding or have become frustrated with breastfeeding.  Your nipples are cracked or bleeding.  Your breasts are red, tender, or warm.  You have: ? Painful breasts or nipples. ? A swollen area on either breast. ? A fever or chills. ? Nausea or vomiting. ? Drainage other than breast milk from your nipples.  Your breasts do not become full before feedings by the fifth day after you give birth.  You feel sad and depressed.  Your baby is: ? Too sleepy to eat well. ? Having trouble sleeping. ? More than 1 week old and wetting fewer than 6   More than 32 week old and wetting fewer than 6 diapers in a 24-hour period. ? Not gaining weight by 80 days of age.  Your baby has fewer than 3 stools in a 24-hour period.  Your baby's skin or the white  parts of his or her eyes become yellow. Get help right away if:  Your baby is overly tired (lethargic) and does not want to wake up and feed.  Your baby develops an unexplained fever. Summary  Breastfeeding offers many health benefits for infant and mothers.  Try to breastfeed your infant when he or she shows early signs of hunger.  Gently tickle or stroke your baby's lips with your finger or nipple to allow the baby to open his or her mouth. Bring the baby to your breast. Make sure that much of the areola is in your baby's mouth. Offer one side and burp the baby before you offer the other side.  Talk with your health care provider or lactation consultant if you have questions or you face problems as you breastfeed. This information is not intended to replace advice given to you by your health care provider. Make sure you discuss any questions you have with your health care provider. Document Revised: 07/04/2017 Document Reviewed: 05/11/2016 Elsevier Patient Education  [place].

## 2019-11-19 ENCOUNTER — Ambulatory Visit (INDEPENDENT_AMBULATORY_CARE_PROVIDER_SITE_OTHER): Payer: 59 | Admitting: Certified Nurse Midwife

## 2019-11-19 DIAGNOSIS — O2242 Hemorrhoids in pregnancy, second trimester: Secondary | ICD-10-CM

## 2019-11-19 DIAGNOSIS — Z3482 Encounter for supervision of other normal pregnancy, second trimester: Secondary | ICD-10-CM

## 2019-11-19 DIAGNOSIS — Z3A24 24 weeks gestation of pregnancy: Secondary | ICD-10-CM

## 2019-11-19 LAB — POCT URINALYSIS DIPSTICK OB
Bilirubin, UA: NEGATIVE
Blood, UA: NEGATIVE
Glucose, UA: NEGATIVE
Ketones, UA: NEGATIVE
Leukocytes, UA: NEGATIVE
Nitrite, UA: NEGATIVE
POC,PROTEIN,UA: NEGATIVE
Spec Grav, UA: 1.01 (ref 1.010–1.025)
Urobilinogen, UA: 0.2 E.U./dL
pH, UA: 6.5 (ref 5.0–8.0)

## 2019-11-19 NOTE — Progress Notes (Signed)
ROB-Doing well, reports hemorrhoid. Discussed home treatment measures; safe medication list provided, see AVS. Waterbirth class completed and Oasis tub purchased. Offered assistance selecting doula or tub manager patient and spouse declined since they are planning home waterbirth assisted by her mother (both she and mother have [employer] training, neither have midwifery or OB/GYN training). Encouraged use of skilled birth attendant; list of home birth midwives provided via MyChart. Reiterated that midwives of [facility] attend births only at [facility]. Anticipatory guidance regarding course of prenatal care including 28 week labs. Reviewed red flag symptoms and when to call. RTC x 4 weeks for 28 week labs and ROB or sooner if needed.

## 2019-12-21 ENCOUNTER — Encounter: Payer: Self-pay | Admitting: Certified Nurse Midwife

## 2019-12-21 ENCOUNTER — Ambulatory Visit (INDEPENDENT_AMBULATORY_CARE_PROVIDER_SITE_OTHER): Payer: 59 | Admitting: Certified Nurse Midwife

## 2019-12-21 ENCOUNTER — Other Ambulatory Visit: Payer: Self-pay

## 2019-12-21 ENCOUNTER — Other Ambulatory Visit: Payer: 59

## 2019-12-21 VITALS — BP 124/77 | HR 88 | Wt 163.4 lb

## 2019-12-21 DIAGNOSIS — Z23 Encounter for immunization: Secondary | ICD-10-CM | POA: Diagnosis not present

## 2019-12-21 DIAGNOSIS — Z3A28 28 weeks gestation of pregnancy: Secondary | ICD-10-CM | POA: Diagnosis not present

## 2019-12-21 LAB — POCT URINALYSIS DIPSTICK OB
Bilirubin, UA: NEGATIVE
Blood, UA: NEGATIVE
Glucose, UA: NEGATIVE
Ketones, UA: NEGATIVE
Leukocytes, UA: NEGATIVE
Nitrite, UA: NEGATIVE
POC,PROTEIN,UA: NEGATIVE
Spec Grav, UA: 1.01 (ref 1.010–1.025)
Urobilinogen, UA: 0.2 E.U./dL
pH, UA: 5 (ref 5.0–8.0)

## 2019-12-21 MED ORDER — TETANUS-DIPHTH-ACELL PERTUSSIS 5-2.5-18.5 LF-MCG/0.5 IM SUSP
0.5000 mL | Freq: Once | INTRAMUSCULAR | Status: AC
  Administered 2019-12-21: 0.5 mL via INTRAMUSCULAR

## 2019-12-21 NOTE — Patient Instructions (Signed)
Glucose Tolerance Test During Pregnancy Why am I having this test? The glucose tolerance test (GTT) is done to check how your body processes sugar (glucose). This is one of several tests used to diagnose diabetes that develops during pregnancy (gestational diabetes mellitus). Gestational diabetes is a temporary form of diabetes that some women develop during pregnancy. It usually occurs during the second trimester of pregnancy and goes away after delivery. Testing (screening) for gestational diabetes usually occurs between 24 and 28 weeks of pregnancy. You may have the GTT test after having a 1-hour glucose screening test if the results from that test indicate that you may have gestational diabetes. You may also have this test if:  You have a history of gestational diabetes.  You have a history of giving birth to very large babies or have experienced repeated fetal loss (stillbirth).  You have signs and symptoms of diabetes, such as: ? Changes in your vision. ? Tingling or numbness in your hands or feet. ? Changes in hunger, thirst, and urination that are not otherwise explained by your pregnancy. What is being tested? This test measures the amount of glucose in your blood at different times during a period of 3 hours. This indicates how well your body is able to process glucose. What kind of sample is taken?  Blood samples are required for this test. They are usually collected by inserting a needle into a blood vessel. How do I prepare for this test?  For 3 days before your test, eat normally. Have plenty of carbohydrate-rich foods.  Follow instructions from your health care provider about: ? Eating or drinking restrictions on the day of the test. You may be asked to not eat or drink anything other than water (fast) starting 8-10 hours before the test. ? Changing or stopping your regular medicines. Some medicines may interfere with this test. Tell a health care provider about:  All  medicines you are taking, including vitamins, herbs, eye drops, creams, and over-the-counter medicines.  Any blood disorders you have.  Any surgeries you have had.  Any medical conditions you have. What happens during the test? First, your blood glucose will be measured. This is referred to as your fasting blood glucose, since you fasted before the test. Then, you will drink a glucose solution that contains a certain amount of glucose. Your blood glucose will be measured again 1, 2, and 3 hours after drinking the solution. This test takes about 3 hours to complete. You will need to stay at the testing location during this time. During the testing period:  Do not eat or drink anything other than the glucose solution.  Do not exercise.  Do not use any products that contain nicotine or tobacco, such as cigarettes and e-cigarettes. If you need help stopping, ask your health care provider. The testing procedure may vary among health care providers and hospitals. How are the results reported? Your results will be reported as milligrams of glucose per deciliter of blood (mg/dL) or millimoles per liter (mmol/L). Your health care provider will compare your results to normal ranges that were established after testing a large group of people (reference ranges). Reference ranges may vary among labs and hospitals. For this test, common reference ranges are:  Fasting: less than 95-105 mg/dL (5.3-5.8 mmol/L).  1 hour after drinking glucose: less than 180-190 mg/dL (10.0-10.5 mmol/L).  2 hours after drinking glucose: less than 155-165 mg/dL (8.6-9.2 mmol/L).  3 hours after drinking glucose: 140-145 mg/dL (7.8-8.1 mmol/L). What do the   results mean? Results within reference ranges are considered normal, meaning that your glucose levels are well-controlled. If two or more of your blood glucose levels are high, you may be diagnosed with gestational diabetes. If only one level is high, your health care  provider may suggest repeat testing or other tests to confirm a diagnosis. Talk with your health care provider about what your results mean. Questions to ask your health care provider Ask your health care provider, or the department that is doing the test:  When will my results be ready?  How will I get my results?  What are my treatment options?  What other tests do I need?  What are my next steps? Summary  The glucose tolerance test (GTT) is one of several tests used to diagnose diabetes that develops during pregnancy (gestational diabetes mellitus). Gestational diabetes is a temporary form of diabetes that some women develop during pregnancy.  You may have the GTT test after having a 1-hour glucose screening test if the results from that test indicate that you may have gestational diabetes. You may also have this test if you have any symptoms or risk factors for gestational diabetes.  Talk with your health care provider about what your results mean. This information is not intended to replace advice given to you by your health care provider. Make sure you discuss any questions you have with your health care provider. Document Revised: 07/31/2018 Document Reviewed: 11/19/2016 Elsevier Patient Education  2020 [employer]  

## 2019-12-21 NOTE — Progress Notes (Signed)
ROB doing well. Feels good movement. Glucose screen, cbc/rpr/BTC today. Discussed birth control after baby , plans tubal is unsrue if she is going to do it after delivery or outpatient. Will follow up next visit ( will have sign tubal consent). Ready set baby completed today see check list.. Follow up 2 wks.   Doreene Burke , CNM

## 2019-12-22 LAB — CBC
Hematocrit: 33.6 % — ABNORMAL LOW (ref 34.0–46.6)
Hemoglobin: 11.2 g/dL (ref 11.1–15.9)
MCH: 30.6 pg (ref 26.6–33.0)
MCHC: 33.3 g/dL (ref 31.5–35.7)
MCV: 92 fL (ref 79–97)
Platelets: 283 10*3/uL (ref 150–450)
RBC: 3.66 x10E6/uL — ABNORMAL LOW (ref 3.77–5.28)
RDW: 13.2 % (ref 11.7–15.4)
WBC: 11.6 10*3/uL — ABNORMAL HIGH (ref 3.4–10.8)

## 2019-12-22 LAB — RPR: RPR Ser Ql: NONREACTIVE

## 2019-12-22 LAB — GLUCOSE, 1 HOUR GESTATIONAL: Gestational Diabetes Screen: 156 mg/dL — ABNORMAL HIGH (ref 65–139)

## 2020-01-04 ENCOUNTER — Ambulatory Visit (INDEPENDENT_AMBULATORY_CARE_PROVIDER_SITE_OTHER): Payer: 59 | Admitting: Certified Nurse Midwife

## 2020-01-04 ENCOUNTER — Other Ambulatory Visit: Payer: Self-pay

## 2020-01-04 ENCOUNTER — Encounter: Payer: Self-pay | Admitting: Certified Nurse Midwife

## 2020-01-04 DIAGNOSIS — Z3A30 30 weeks gestation of pregnancy: Secondary | ICD-10-CM

## 2020-01-04 DIAGNOSIS — Z23 Encounter for immunization: Secondary | ICD-10-CM

## 2020-01-04 LAB — POCT URINALYSIS DIPSTICK OB
Bilirubin, UA: NEGATIVE
Blood, UA: NEGATIVE
Ketones, UA: NEGATIVE
Leukocytes, UA: NEGATIVE
Nitrite, UA: NEGATIVE
POC,PROTEIN,UA: NEGATIVE
Spec Grav, UA: 1.015 (ref 1.010–1.025)
Urobilinogen, UA: 0.2 E.U./dL
pH, UA: 6 (ref 5.0–8.0)

## 2020-01-04 NOTE — Patient Instructions (Signed)
Braxton Hicks Contractions °Contractions of the uterus can occur throughout pregnancy, but they are not always a sign that you are in labor. You may have practice contractions called Braxton Hicks contractions. These false labor contractions are sometimes confused with true labor. °What are Braxton Hicks contractions? °Braxton Hicks contractions are tightening movements that occur in the muscles of the uterus before labor. Unlike true labor contractions, these contractions do not result in opening (dilation) and thinning of the cervix. Toward the end of pregnancy (32-34 weeks), Braxton Hicks contractions can happen more often and may become stronger. These contractions are sometimes difficult to tell apart from true labor because they can be very uncomfortable. You should not feel embarrassed if you go to the hospital with false labor. °Sometimes, the only way to tell if you are in true labor is for your health care provider to look for changes in the cervix. The health care provider will do a physical exam and may monitor your contractions. If you are not in true labor, the exam should show that your cervix is not dilating and your water has not broken. °If there are no other health problems associated with your pregnancy, it is completely safe for you to be sent home with false labor. You may continue to have Braxton Hicks contractions until you go into true labor. °How to tell the difference between true labor and false labor °True labor °· Contractions last 30-70 seconds. °· Contractions become very regular. °· Discomfort is usually felt in the top of the uterus, and it spreads to the lower abdomen and low back. °· Contractions do not go away with walking. °· Contractions usually become more intense and increase in frequency. °· The cervix dilates and gets thinner. °False labor °· Contractions are usually shorter and not as strong as true labor contractions. °· Contractions are usually irregular. °· Contractions  are often felt in the front of the lower abdomen and in the groin. °· Contractions may go away when you walk around or change positions while lying down. °· Contractions get weaker and are shorter-lasting as time goes on. °· The cervix usually does not dilate or become thin. °Follow these instructions at home: ° °· Take over-the-counter and prescription medicines only as told by your health care provider. °· Keep up with your usual exercises and follow other instructions from your health care provider. °· Eat and drink lightly if you think you are going into labor. °· If Braxton Hicks contractions are making you uncomfortable: °? Change your position from lying down or resting to walking, or change from walking to resting. °? Sit and rest in a tub of warm water. °? Drink enough fluid to keep your urine pale yellow. Dehydration may cause these contractions. °? Do slow and deep breathing several times an hour. °· Keep all follow-up prenatal visits as told by your health care provider. This is important. °Contact a health care provider if: °· You have a fever. °· You have continuous pain in your abdomen. °Get help right away if: °· Your contractions become stronger, more regular, and closer together. °· You have fluid leaking or gushing from your vagina. °· You pass blood-tinged mucus (bloody show). °· You have bleeding from your vagina. °· You have low back pain that you never had before. °· You feel your baby’s head pushing down and causing pelvic pressure. °· Your baby is not moving inside you as much as it used to. °Summary °· Contractions that occur before labor are   called Braxton Hicks contractions, false labor, or practice contractions. °· Braxton Hicks contractions are usually shorter, weaker, farther apart, and less regular than true labor contractions. True labor contractions usually become progressively stronger and regular, and they become more frequent. °· Manage discomfort from Braxton Hicks contractions  by changing position, resting in a warm bath, drinking plenty of water, or practicing deep breathing. °This information is not intended to replace advice given to you by your health care provider. Make sure you discuss any questions you have with your health care provider. °Document Revised: 03/22/2017 Document Reviewed: 08/23/2016 °Elsevier Patient Education © 2020 Elsevier Inc. ° °

## 2020-01-04 NOTE — Progress Notes (Signed)
ROB doing well. Feels good movement. Has normal musculoskeletal discomforts. Discussed self help measures. Reassurance given. Follow up 2 wk  Doreene Burke, CNM

## 2020-01-18 ENCOUNTER — Encounter: Payer: Self-pay | Admitting: Certified Nurse Midwife

## 2020-01-18 ENCOUNTER — Ambulatory Visit (INDEPENDENT_AMBULATORY_CARE_PROVIDER_SITE_OTHER): Payer: 59 | Admitting: Certified Nurse Midwife

## 2020-01-18 ENCOUNTER — Other Ambulatory Visit: Payer: Self-pay

## 2020-01-18 ENCOUNTER — Other Ambulatory Visit (HOSPITAL_COMMUNITY)
Admission: RE | Admit: 2020-01-18 | Discharge: 2020-01-18 | Disposition: A | Discharge status: Home / Self Care | Payer: 59 | Source: Ambulatory Visit | Attending: Certified Nurse Midwife | Admitting: Certified Nurse Midwife

## 2020-01-18 VITALS — BP 118/78 | HR 81 | Wt 165.6 lb

## 2020-01-18 DIAGNOSIS — N898 Other specified noninflammatory disorders of vagina: Secondary | ICD-10-CM

## 2020-01-18 DIAGNOSIS — Z3A32 32 weeks gestation of pregnancy: Secondary | ICD-10-CM

## 2020-01-18 DIAGNOSIS — O26893 Other specified pregnancy related conditions, third trimester: Secondary | ICD-10-CM | POA: Diagnosis not present

## 2020-01-18 LAB — POCT URINALYSIS DIPSTICK OB
Bilirubin, UA: NEGATIVE
Blood, UA: NEGATIVE
Glucose, UA: NEGATIVE
Ketones, UA: NEGATIVE
Leukocytes, UA: NEGATIVE
Nitrite, UA: NEGATIVE
POC,PROTEIN,UA: NEGATIVE
Spec Grav, UA: 1.025 (ref 1.010–1.025)
Urobilinogen, UA: 0.2 E.U./dL
pH, UA: 5 (ref 5.0–8.0)

## 2020-01-18 NOTE — Progress Notes (Signed)
ROB doing well. Feels good movement. Complains of increased vaginal discharge. Spec exam, shows white discharge no odor. No fluid seen coming from cervix, nitrazine negative. Swab collected. Will follow up with results. Birth plan reviewed. Copy to chart.  Follow up 2 wk.   Doreene Burke, CNM

## 2020-01-20 LAB — CERVICOVAGINAL ANCILLARY ONLY
Bacterial Vaginitis (gardnerella): NEGATIVE
Candida Glabrata: NEGATIVE
Candida Vaginitis: NEGATIVE
Comment: NEGATIVE
Comment: NEGATIVE
Comment: NEGATIVE

## 2020-02-01 ENCOUNTER — Ambulatory Visit (INDEPENDENT_AMBULATORY_CARE_PROVIDER_SITE_OTHER): Payer: 59 | Admitting: Certified Nurse Midwife

## 2020-02-01 ENCOUNTER — Other Ambulatory Visit: Payer: Self-pay

## 2020-02-01 ENCOUNTER — Encounter: Payer: Self-pay | Admitting: Certified Nurse Midwife

## 2020-02-01 VITALS — BP 121/88 | HR 103 | Wt 170.0 lb

## 2020-02-01 DIAGNOSIS — Z3A34 34 weeks gestation of pregnancy: Secondary | ICD-10-CM

## 2020-02-01 LAB — POCT URINALYSIS DIPSTICK OB
Bilirubin, UA: NEGATIVE
Blood, UA: NEGATIVE
Glucose, UA: NEGATIVE
Ketones, UA: NEGATIVE
Leukocytes, UA: NEGATIVE
Nitrite, UA: NEGATIVE
POC,PROTEIN,UA: NEGATIVE
Spec Grav, UA: 1.02
Urobilinogen, UA: 0.2 U/dL
pH, UA: 6

## 2020-02-01 NOTE — Patient Instructions (Signed)
Braxton Hicks Contractions °Contractions of the uterus can occur throughout pregnancy, but they are not always a sign that you are in labor. You may have practice contractions called Braxton Hicks contractions. These false labor contractions are sometimes confused with true labor. °What are Braxton Hicks contractions? °Braxton Hicks contractions are tightening movements that occur in the muscles of the uterus before labor. Unlike true labor contractions, these contractions do not result in opening (dilation) and thinning of the cervix. Toward the end of pregnancy (32-34 weeks), Braxton Hicks contractions can happen more often and may become stronger. These contractions are sometimes difficult to tell apart from true labor because they can be very uncomfortable. You should not feel embarrassed if you go to the hospital with false labor. °Sometimes, the only way to tell if you are in true labor is for your health care provider to look for changes in the cervix. The health care provider will do a physical exam and may monitor your contractions. If you are not in true labor, the exam should show that your cervix is not dilating and your water has not broken. °If there are no other health problems associated with your pregnancy, it is completely safe for you to be sent home with false labor. You may continue to have Braxton Hicks contractions until you go into true labor. °How to tell the difference between true labor and false labor °True labor °· Contractions last 30-70 seconds. °· Contractions become very regular. °· Discomfort is usually felt in the top of the uterus, and it spreads to the lower abdomen and low back. °· Contractions do not go away with walking. °· Contractions usually become more intense and increase in frequency. °· The cervix dilates and gets thinner. °False labor °· Contractions are usually shorter and not as strong as true labor contractions. °· Contractions are usually irregular. °· Contractions  are often felt in the front of the lower abdomen and in the groin. °· Contractions may go away when you walk around or change positions while lying down. °· Contractions get weaker and are shorter-lasting as time goes on. °· The cervix usually does not dilate or become thin. °Follow these instructions at home: ° °· Take over-the-counter and prescription medicines only as told by your health care provider. °· Keep up with your usual exercises and follow other instructions from your health care provider. °· Eat and drink lightly if you think you are going into labor. °· If Braxton Hicks contractions are making you uncomfortable: °? Change your position from lying down or resting to walking, or change from walking to resting. °? Sit and rest in a tub of warm water. °? Drink enough fluid to keep your urine pale yellow. Dehydration may cause these contractions. °? Do slow and deep breathing several times an hour. °· Keep all follow-up prenatal visits as told by your health care provider. This is important. °Contact a health care provider if: °· You have a fever. °· You have continuous pain in your abdomen. °Get help right away if: °· Your contractions become stronger, more regular, and closer together. °· You have fluid leaking or gushing from your vagina. °· You pass blood-tinged mucus (bloody show). °· You have bleeding from your vagina. °· You have low back pain that you never had before. °· You feel your baby’s head pushing down and causing pelvic pressure. °· Your baby is not moving inside you as much as it used to. °Summary °· Contractions that occur before labor are   called Braxton Hicks contractions, false labor, or practice contractions. °· Braxton Hicks contractions are usually shorter, weaker, farther apart, and less regular than true labor contractions. True labor contractions usually become progressively stronger and regular, and they become more frequent. °· Manage discomfort from Braxton Hicks contractions  by changing position, resting in a warm bath, drinking plenty of water, or practicing deep breathing. °This information is not intended to replace advice given to you by your health care provider. Make sure you discuss any questions you have with your health care provider. °Document Revised: 03/22/2017 Document Reviewed: 08/23/2016 °Elsevier Patient Education © 2020 Elsevier Inc. ° °

## 2020-02-01 NOTE — Progress Notes (Signed)
ROB doing well. Feels good movement. BP elevated today, she denies headache, visual changes , epigastric pain . Normal reflexs, negative clonus. Rpeat BP normal. Follow up 1 wk with Pattricia Boss

## 2020-02-09 ENCOUNTER — Other Ambulatory Visit: Payer: Self-pay | Admitting: Certified Nurse Midwife

## 2020-02-09 ENCOUNTER — Ambulatory Visit (INDEPENDENT_AMBULATORY_CARE_PROVIDER_SITE_OTHER): Payer: 59

## 2020-02-09 ENCOUNTER — Other Ambulatory Visit: Payer: 59

## 2020-02-09 ENCOUNTER — Telehealth: Payer: Self-pay

## 2020-02-09 ENCOUNTER — Ambulatory Visit (INDEPENDENT_AMBULATORY_CARE_PROVIDER_SITE_OTHER): Payer: 59 | Admitting: Certified Nurse Midwife

## 2020-02-09 ENCOUNTER — Other Ambulatory Visit: Payer: Self-pay

## 2020-02-09 VITALS — BP 139/80 | HR 88 | Wt 170.1 lb

## 2020-02-09 DIAGNOSIS — O365920 Maternal care for other known or suspected poor fetal growth, second trimester, not applicable or unspecified: Secondary | ICD-10-CM

## 2020-02-09 DIAGNOSIS — O480 Post-term pregnancy: Secondary | ICD-10-CM | POA: Diagnosis not present

## 2020-02-09 DIAGNOSIS — O10919 Unspecified pre-existing hypertension complicating pregnancy, unspecified trimester: Secondary | ICD-10-CM | POA: Diagnosis not present

## 2020-02-09 DIAGNOSIS — Z3A36 36 weeks gestation of pregnancy: Secondary | ICD-10-CM | POA: Diagnosis not present

## 2020-02-09 LAB — POCT URINALYSIS DIPSTICK OB
Bilirubin, UA: NEGATIVE
Blood, UA: NEGATIVE
Glucose, UA: NEGATIVE
Ketones, UA: NEGATIVE
Leukocytes, UA: NEGATIVE
Nitrite, UA: NEGATIVE
POC,PROTEIN,UA: NEGATIVE
Spec Grav, UA: 1.02 (ref 1.010–1.025)
Urobilinogen, UA: 0.2 E.U./dL
pH, UA: 6 (ref 5.0–8.0)

## 2020-02-09 NOTE — Telephone Encounter (Signed)
Pt called in and stated that she was returning a call. The pt was left an VM about her appointment  at10 AM Thursday February 11, 2020 [facility] [address].[facility]. I saw where the nurse called and I read the message to the pt that she has a appointment at   10 AM Thursday February 11, 2020 [facility] [address] [phone]. [facility]- go through the front door take a left, pass the elevators and go through 2 sets of double doors then take a right. Should be 2-3 doors down the hall. The pt verbally understood

## 2020-02-09 NOTE — Telephone Encounter (Signed)
Detailed voicemail left for patient- She has an appointment scheduled for 10 AM Thursday February 11, 2020 [facility] [address] [phone]. [facility]- go through the front door take a left, pass the elevators and go through 2 sets of double doors then take a right. Should be 2-3 doors down the hall.

## 2020-02-09 NOTE — Progress Notes (Signed)
NST , ROB , u/s today for chronic hypertension on medication in pregnancy. NST reactive, U/s findings normal AFI , IUGR 5th percentile. Dr. Logan Bores consulted. MFM consult ordered.Discussed fetal kick counts. Pt to follow up this week with MFM and ROB in one week.   Doreene Burke, CNM  NST: reactive Baseline 140 Accelerations present Decelerations absent Toco irritability  Patient Name: Connie Webster DOB: 12/13/1980 MRN: [id number] ULTRASOUND REPORT  Location: [facility] Date of Service: 02/09/2020   Indications:growth/afi Findings:  Mason Jim intrauterine pregnancy is visualized with FHR at 161 BPM. Biometrics give an (U/S) Gestational age of [redacted]w[redacted]d and an (U/S) EDD of 12/08/23021; this correlates with the clinically established Estimated Date of Delivery: 03/08/20.  Fetal presentation is Cephalic.  Placenta: anterior. Grade: 1 AFI: 12.6 cm  Growth is 5 % . EFW: 2035 g (4 lbs 8 oz)   Umbilical Artery Dopplers were performed due to 5 %, fetal growth restriction and low amniotic fluid index  Systolic and Diastolic blood flow in each umbilical artery appear normal and without reversal or absence of diastolic flow.   The maximum S/D ratio is 2.83. 50 % percentile.  According to perinatology.com, this ratio is normal for this gestational age.  Impression: 1. [redacted]w[redacted]d Viable Singleton Intrauterine pregnancy previously established criteria. 2. Growth is 5%ile.  AFI is 12.6 cm.  3. Fetus is growing about 3 weeks behind symmetrically .  4. SD ration falls within the 50 % ile.  Recommendations: 1.Clinical correlation with the patient's History and Physical Exam.   Jenine  M. Alessi     RDMSJenine  M. Marciano Sequin     RDMS

## 2020-02-09 NOTE — Patient Instructions (Signed)
Fetal Movement Counts Patient Name: ________________________________________________ Patient Due Date: ____________________ What is a fetal movement count?  A fetal movement count is the number of times that you feel your baby move during a certain amount of time. This may also be called a fetal kick count. A fetal movement count is recommended for every pregnant woman. You may be asked to start counting fetal movements as early as week 28 of your pregnancy. Pay attention to when your baby is most active. You may notice your baby's sleep and wake cycles. You may also notice things that make your baby move more. You should do a fetal movement count:  When your baby is normally most active.  At the same time each day. A good time to count movements is while you are resting, after having something to eat and drink. How do I count fetal movements? 1. Find a quiet, comfortable area. Sit, or lie down on your side. 2. Write down the date, the start time and stop time, and the number of movements that you felt between those two times. Take this information with you to your health care visits. 3. Write down your start time when you feel the first movement. 4. Count kicks, flutters, swishes, rolls, and jabs. You should feel at least 10 movements. 5. You may stop counting after you have felt 10 movements, or if you have been counting for 2 hours. Write down the stop time. 6. If you do not feel 10 movements in 2 hours, contact your health care provider for further instructions. Your health care provider may want to do additional tests to assess your baby's well-being. Contact a health care provider if:  You feel fewer than 10 movements in 2 hours.  Your baby is not moving like he or she usually does. Date: ____________ Start time: ____________ Stop time: ____________ Movements: ____________ Date: ____________ Start time: ____________ Stop time: ____________ Movements: ____________ Date: ____________  Start time: ____________ Stop time: ____________ Movements: ____________ Date: ____________ Start time: ____________ Stop time: ____________ Movements: ____________ Date: ____________ Start time: ____________ Stop time: ____________ Movements: ____________ Date: ____________ Start time: ____________ Stop time: ____________ Movements: ____________ Date: ____________ Start time: ____________ Stop time: ____________ Movements: ____________ Date: ____________ Start time: ____________ Stop time: ____________ Movements: ____________ Date: ____________ Start time: ____________ Stop time: ____________ Movements: ____________ This information is not intended to replace advice given to you by your health care provider. Make sure you discuss any questions you have with your health care provider. Document Revised: 11/27/2018 Document Reviewed: 11/27/2018 Elsevier Patient Education  2020 Elsevier Inc. Intrauterine Growth Restriction  Intrauterine growth restriction (IUGR) is when a baby is not growing normally during pregnancy. A baby with IUGR is smaller than it should be and may weigh less than normal at birth. IUGR can result from a problem with the organ that supplies the unborn baby (fetus) with oxygen and nutrition (placenta). Usually, there is no way to prevent this type of problem. Babies with IUGR are at higher risk for early delivery and needing special (intensive) care after birth. What are the causes? The most common cause of IUGR is a problem with the placenta or umbilical cord that causes the fetus to get less oxygen or nutrition than needed. Other causes include:  The mother eating a very unhealthy diet (poor maternal nutrition).  Exposure to chemicals found in substances such as cigarettes, alcohol, and some drugs.  Some prescription medicines.  Other problems that develop in the womb (congenital birth defects).  Genetic disorders.  Infection.  Carrying more than one baby. What  increases the risk? This condition is more likely to affect babies of mothers who:  Are over the age of 39 at the time of delivery.  Are younger than age 53 at the time of delivery.  Have medical conditions such as high blood pressure, preeclampsia, diabetes, heart or kidney disease, systemic lupus erythematosus, or anemia.  Live at a very high altitude during pregnancy.  Have a personal history or family history of: ? IUGR. ? A genetic disorder.  Take medicines during pregnancy that are related to congenital disabilities.  Come into contact with infected cat feces (toxoplasmosis).  Come into contact with chickenpox (varicella) or [place] measles (rubella).  Have or are at risk of getting an infectious disease such as syphilis, HIV, or herpes.  Eat an unhealthy diet during pregnancy.  Weigh less than 100 pounds.  Have had treatments to help her have children (infertility treatments).  Use tobacco, drugs, or alcohol during pregnancy. What are the signs or symptoms? IUGR does not cause many symptoms. You might notice that your baby does not move or kick very often. Also, your belly may not be as big as expected for the stage of your pregnancy. How is this diagnosed? This condition is diagnosed with physical exams and prenatal exams. You may also have:  Fundal measurements to check the size of your uterus.  An ultrasound done to measure your baby's size compared to the size of other babies at the same stage of development (gestational age). Your health care provider will monitor your baby's growth with ultrasounds throughout pregnancy. You may also have tests to find the cause of IUGR. These may include:  Amniocentesis. This is a procedure that involves passing a needle into the uterus to collect a sample of fluid that surrounds the fetus (amniotic fluid). This may be done to check for signs of infection or congenital defects.  Tests to evaluate blood flow to your baby and  placenta. How is this treated? In most cases, the goal of treatment is to treat the cause of IUGR. Your health care providers will monitor your pregnancy closely and help you manage your pregnancy. If your condition is caused by a placenta problem and your baby is not getting enough blood, you may need:  Medicine to start labor and deliver your baby early (induction).  Cesarean delivery, also called a C-section. In this procedure, your baby is delivered through an incision in your abdomen and uterus. Follow these instructions at home: Medicines  Take over-the-counter and prescription medicines only as told by your health care provider. This includes vitamins and supplements.  Make sure that your health care provider knows about and approves of all the medicines, supplements, vitamins, eye drops, and creams that you use. General instructions  Eat a healthy diet that includes fresh fruits and vegetables, lean proteins, whole grains, and calcium-rich foods such as milk, yogurt, and dark, leafy greens. Work with your health care provider or a dietitian to make sure that: ? You are getting enough nutrients. ? You are gaining enough weight.  Rest as needed. Try to get at least 8 hours of sleep every night.  Do not drink alcohol or use drugs.  Do not use any products that contain nicotine or tobacco, such as cigarettes and e-cigarettes. If you need help quitting, ask your health care provider.  Keep all follow-up visits as told by your health care provider. This is important. Get help right  away if you:  Notice that your baby is moving less than usual or is not moving.  Have contractions that are 5 minutes or less apart, or that increase in frequency, intensity, or length.  Have signs and symptoms of infection, including a fever.  Have vaginal bleeding.  Have increased swelling in your legs, hands, or face.  Have vision changes, including seeing spots or having blurry or double  vision.  Have a severe headache that does not go away.  Have sudden, sharp abdominal pain or low back pain.  Have an uncontrolled gush or trickle of fluid from your vagina. Summary  Intrauterine growth restriction (IUGR) is when a baby is not growing normally during pregnancy.  The most common cause of IUGR is a problem with the placenta or umbilical cord that causes the fetus to get less oxygen or nutrition than needed.  This condition is diagnosed with physical and prenatal exams. Your health care provider will monitor your baby's growth with ultrasounds throughout pregnancy.  Make sure that your health care provider knows about and approves of all the medicines, supplements, vitamins, eye drops, and creams that you use. This information is not intended to replace advice given to you by your health care provider. Make sure you discuss any questions you have with your health care provider. Document Revised: 03/22/2017 Document Reviewed: 02/07/2017 Elsevier Patient Education  2020 [employer].

## 2020-02-10 ENCOUNTER — Other Ambulatory Visit: Payer: Self-pay | Admitting: Certified Nurse Midwife

## 2020-02-10 DIAGNOSIS — O09523 Supervision of elderly multigravida, third trimester: Secondary | ICD-10-CM

## 2020-02-10 DIAGNOSIS — O365931 Maternal care for other known or suspected poor fetal growth, third trimester, fetus 1: Secondary | ICD-10-CM

## 2020-02-11 ENCOUNTER — Other Ambulatory Visit: Payer: Self-pay

## 2020-02-11 ENCOUNTER — Encounter: Payer: Self-pay | Admitting: Obstetrics and Gynecology

## 2020-02-11 ENCOUNTER — Other Ambulatory Visit: Payer: Self-pay | Admitting: Certified Nurse Midwife

## 2020-02-11 ENCOUNTER — Ambulatory Visit (HOSPITAL_BASED_OUTPATIENT_CLINIC_OR_DEPARTMENT_OTHER): Payer: 59

## 2020-02-11 ENCOUNTER — Observation Stay
Admission: RE | Admit: 2020-02-11 | Discharge: 2020-02-11 | Disposition: A | Discharge status: Home / Self Care | Payer: 59 | Attending: Certified Nurse Midwife | Admitting: Certified Nurse Midwife

## 2020-02-11 DIAGNOSIS — Z3A35 35 weeks gestation of pregnancy: Secondary | ICD-10-CM | POA: Insufficient documentation

## 2020-02-11 DIAGNOSIS — O09523 Supervision of elderly multigravida, third trimester: Secondary | ICD-10-CM

## 2020-02-11 DIAGNOSIS — O365931 Maternal care for other known or suspected poor fetal growth, third trimester, fetus 1: Secondary | ICD-10-CM | POA: Insufficient documentation

## 2020-02-11 DIAGNOSIS — Z7982 Long term (current) use of aspirin: Secondary | ICD-10-CM | POA: Diagnosis not present

## 2020-02-11 DIAGNOSIS — O163 Unspecified maternal hypertension, third trimester: Principal | ICD-10-CM | POA: Insufficient documentation

## 2020-02-11 DIAGNOSIS — O10913 Unspecified pre-existing hypertension complicating pregnancy, third trimester: Secondary | ICD-10-CM

## 2020-02-11 DIAGNOSIS — O10919 Unspecified pre-existing hypertension complicating pregnancy, unspecified trimester: Secondary | ICD-10-CM

## 2020-02-11 DIAGNOSIS — F419 Anxiety disorder, unspecified: Secondary | ICD-10-CM

## 2020-02-11 DIAGNOSIS — O99340 Other mental disorders complicating pregnancy, unspecified trimester: Secondary | ICD-10-CM

## 2020-02-11 LAB — STREP GP B NAA: Strep Gp B NAA: NEGATIVE

## 2020-02-11 LAB — COMPREHENSIVE METABOLIC PANEL
ALT: 9 U/L (ref 0–44)
AST: 15 U/L (ref 15–41)
Albumin: 2.9 g/dL — ABNORMAL LOW (ref 3.5–5.0)
Alkaline Phosphatase: 136 U/L — ABNORMAL HIGH (ref 38–126)
Anion gap: 10 (ref 5–15)
BUN: 8 mg/dL (ref 6–20)
CO2: 23 mmol/L (ref 22–32)
Calcium: 9.2 mg/dL (ref 8.9–10.3)
Chloride: 101 mmol/L (ref 98–111)
Creatinine, Ser: 0.67 mg/dL (ref 0.44–1.00)
GFR, Estimated: >60 mL/min (ref >60)
Glucose, Bld: 90 mg/dL (ref 70–99)
Potassium: 4 mmol/L (ref 3.5–5.1)
Sodium: 134 mmol/L — ABNORMAL LOW (ref 135–145)
Total Bilirubin: 0.5 mg/dL (ref 0.3–1.2)
Total Protein: 6.7 g/dL (ref 6.5–8.1)

## 2020-02-11 LAB — CBC
HCT: 33.7 % — ABNORMAL LOW (ref 36.0–46.0)
Hemoglobin: 11 g/dL — ABNORMAL LOW (ref 12.0–15.0)
MCH: 29.6 pg (ref 26.0–34.0)
MCHC: 32.6 g/dL (ref 30.0–36.0)
MCV: 90.6 fL (ref 80.0–100.0)
Platelets: 288 10*3/uL (ref 150–400)
RBC: 3.72 MIL/uL — ABNORMAL LOW (ref 3.87–5.11)
RDW: 14.2 % (ref 11.5–15.5)
WBC: 14.2 10*3/uL — ABNORMAL HIGH (ref 4.0–10.5)
nRBC: 0 % (ref 0.0–0.2)

## 2020-02-11 LAB — PROTEIN / CREATININE RATIO, URINE
Creatinine, Urine: 121 mg/dL
Protein Creatinine Ratio: 0.14 mg/mg{Cre} (ref 0.00–0.15)
Total Protein, Urine: 17 mg/dL

## 2020-02-11 MED ORDER — BETAMETHASONE SOD PHOS & ACET 6 (3-3) MG/ML IJ SUSP
12.0000 mg | INTRAMUSCULAR | Status: DC
  Administered 2020-02-11: 12 mg via INTRAMUSCULAR
  Filled 2020-02-11: qty 5

## 2020-02-11 MED ORDER — NIFEDIPINE ER OSMOTIC RELEASE 30 MG PO TB24: 60.0000 mg | ORAL_TABLET | Freq: Every day | ORAL | 5 refills | Status: DC

## 2020-02-11 NOTE — OB Triage Note (Signed)
    L&D OB Triage Note  SUBJECTIVE Connie Webster is a 39 y.o. [id number] female at [redacted]w[redacted]d, EDD Estimated Date of Delivery: 03/16/20 who was sent to triage from MFM office for evaluation of elevated blood pressure. She was seen today by MFM for consultation due to IUGR with chronic hypertension on medications.    OB History  Gravida Para Term Preterm AB Living  4 2 2  0 1 2  SAB TAB Ectopic Multiple Live Births  1 0 0 0 2    # Outcome Date GA Lbr Len/2nd Weight Sex Delivery Anes PTL Lv  4 Current           3 Term 04/18/17 [redacted]w[redacted]d / 02:05 2905 g F Vag-Spont EPI  LIV     Name: TORI, DATTILIO     Apgar1: 7  Apgar5: 8  2 Term 06/12/07 [redacted]w[redacted]d  3232 g F Vag-Spont   LIV  1 SAB 2007 [redacted]w[redacted]d           Medications Prior to Admission  Medication Sig Dispense Refill Last Dose  . albuterol (PROVENTIL HFA;VENTOLIN HFA) 108 (90 BASE) MCG/ACT inhaler Inhale 2 puffs into the lungs every 4 (four) hours as needed for wheezing or shortness of breath.     [redacted]w[redacted]d aspirin EC 81 MG tablet Take 1 tablet (81 mg total) by mouth daily. Take after 12 weeks for prevention of preeclampssia later in pregnancy 300 tablet 2   . folic acid (FOLVITE) 1 MG tablet Take 1 mg by mouth daily.      . Multiple Vitamin (MULTIVITAMIN) tablet Take 1 tablet by mouth daily.     . [DISCONTINUED] NIFEdipine (PROCARDIA XL) 30 MG 24 hr tablet Take 1 tablet (30 mg total) by mouth daily. 30 tablet 5      OBJECTIVE  Nursing Evaluation:   BP (!) 140/95 (BP Location: Left Arm)   Pulse 85   Temp 98.4 F (36.9 C) (Oral)   Resp 14   LMP 06/02/2019 (Exact Date)    Findings:   Pre eclamptic labs normal.       NST was performed and has been reviewed by me.  NST INTERPRETATION: Category I  Mode: External Baseline Rate (A): 130 bpm Variability: Moderate Accelerations: 15 x 15 Decelerations: None     Contraction Frequency (min): UI  ASSESSMENT Impression:  1.  Pregnancy:  07/31/2019 at [redacted]w[redacted]d , EDD Estimated Date  of Delivery: 03/16/20 2.  Reassuring fetal and maternal status 3.  IUGR 4.Chronic hypertension   PLAN 1. Discussed current condition and above findings with patient and reassurance given.  All questions answered. Per MFM recommendation procardia XL dose increased 60 mg daily.  2. Discharge home with standard labor precautions given to return to L&D or call the office for problems. Kick counts reviewed. 3. She will return to the office tomorrow afternoon for second dose of betamethasone 4.Twice weekly antenatal testing. NST on Monday 25th, repeat u/s Thursday 28th with MFM.   Dr.Evans notified of pt status and agrees to plan of care.   10-27-1987, CNM

## 2020-02-11 NOTE — Discharge Instructions (Signed)
Return to [facility] on 02/12/2020 at 3:15 pm for second injection of Betamethasone. Remember to take medication with you.  Schedule an NST for Monday 02/15/2020 at [facility].  Increase Procardia to 60mg  daily. Start 02/11/2020

## 2020-02-11 NOTE — Progress Notes (Unsigned)
Recheck BP in Epic-Dr. Parke Poisson notified. 1400 MFM order to transfer [facility] to [facility]  for [facility] and Steroids. Report given to [facility] via telephone. [name], CNA transported pt via wheelchair to [facility].  Dr. Parke Poisson also spoke with Doreene Burke via telephone about pt Plan of Care.

## 2020-02-11 NOTE — OB Triage Note (Signed)
Pt sent over from MFM for [facility] workup and BMZ injection.

## 2020-02-11 NOTE — Consult Note (Signed)
MFM Consult Note  This patient was seen in consultation due to fetal growth restriction in the setting of chronic hypertension.  The patient reports that she has been treated with Procardia 30 mg XL daily since the beginning of her pregnancy.  On an ultrasound performed in your office 2 days ago, fetal growth restriction was noted.    During her visit in our office today, her blood pressures were 148/96 and 150/98.  She denies any signs or symptoms related to preeclampsia.  She reports that she had to be delivered about 3 weeks prior to her due date during her last pregnancy due to elevated blood pressures.  She denies any other problems in her current pregnancy.  Based on a first trimester ultrasound performed in your office, the patient reports that her [facility] is March 16, 2020.  On today's exam, the overall EFW measured at the 6 percentile (EFW 4lbs 11oz) for her gestational age, indicating fetal growth restriction.  There was normal amniotic fluid noted today.    Doppler studies of the umbilical arteries performed today showed a normal S/D ratio of 2.08.  There were no signs of absent or reversed end-diastolic flow noted today.  A biophysical profile performed today was 8 out of 8.  Due to her elevated blood pressures and the concern for preeclampsia, the patient was sent to labor and delivery following today's ultrasound exam to receive a complete course of antenatal corticosteroids and to be evaluated for preeclampsia.  Her dosage of Procardia may be increased to 60 mg XL daily for better blood pressure control.  Should she be diagnosed with preeclampsia or if her blood pressures are persistently in the high 150s over high 90s range despite medication treatment, I would recommend delivery once she completes her steroid course.  Should her blood pressures be in the 140s over 90s range, she remains asymptomatic, and she rules out for preeclampsia, outpatient management may be considered with  twice weekly fetal testing next week.  She should also be sent back to our office for umbilical artery Doppler studies.  The goal for delivery would be 37 weeks.    Delivery prior to 37 weeks would be indicated:  -should her blood pressures remain in the 150s over high 90s range despite medication  treatment  -should she complain of any signs or symptoms related to preeclampsia  - at any time for nonreassuring fetal status  The patient agrees with this management plan.   At the end of the consultation, the patient stated that all of her questions have been answered to her complete satisfaction.    Thank you for referring this patient for a Maternal-Fetal Medicine consultation.    Total time spent in consultation 35 minutes.

## 2020-02-11 NOTE — Progress Notes (Unsigned)
Dr. Parke Poisson notified of pt VS on arrival.  Will recheck.

## 2020-02-12 ENCOUNTER — Ambulatory Visit (INDEPENDENT_AMBULATORY_CARE_PROVIDER_SITE_OTHER): Payer: 59 | Admitting: Certified Nurse Midwife

## 2020-02-12 DIAGNOSIS — O10919 Unspecified pre-existing hypertension complicating pregnancy, unspecified trimester: Secondary | ICD-10-CM

## 2020-02-12 DIAGNOSIS — Z3A35 35 weeks gestation of pregnancy: Secondary | ICD-10-CM

## 2020-02-12 DIAGNOSIS — O365930 Maternal care for other known or suspected poor fetal growth, third trimester, not applicable or unspecified: Secondary | ICD-10-CM

## 2020-02-12 LAB — GC/CHLAMYDIA PROBE AMP
Chlamydia trachomatis, NAA: NEGATIVE
Neisseria Gonorrhoeae by PCR: NEGATIVE

## 2020-02-12 MED ORDER — BETAMETHASONE SOD PHOS & ACET 6 (3-3) MG/ML IJ SUSP
12.0000 mg | Freq: Once | INTRAMUSCULAR | Status: AC
Start: 2020-02-12 — End: 2020-02-12
  Administered 2020-02-12: 12 mg via INTRAMUSCULAR

## 2020-02-12 NOTE — Progress Notes (Signed)
Patient presents to office for #2 betamethasone injection. Tolerated well. Patient states after taking increased Procardia  yesterday she became very nauseous and had decreased fetal movement. VO per Doreene Burke CNM put on NST.

## 2020-02-12 NOTE — Progress Notes (Signed)
Pt presents for second dose of betamethasone and has complaints of decreased fetal movement. NST preformed.   NST: reactive, category 1  Baseline 115 Moderate variability Accelerations present Decelerations absent  Contractions absent  While have nst done pt begins to feel more movement. Movement audible on the monitor. Reassurance given. Pt to return on Monday as scheduled.   Doreene Burke, CNM

## 2020-02-15 ENCOUNTER — Other Ambulatory Visit: Payer: Self-pay

## 2020-02-15 ENCOUNTER — Other Ambulatory Visit: Payer: 59

## 2020-02-15 ENCOUNTER — Ambulatory Visit (INDEPENDENT_AMBULATORY_CARE_PROVIDER_SITE_OTHER): Payer: 59 | Admitting: Certified Nurse Midwife

## 2020-02-15 ENCOUNTER — Telehealth: Payer: Self-pay

## 2020-02-15 ENCOUNTER — Encounter: Payer: Self-pay | Admitting: Certified Nurse Midwife

## 2020-02-15 DIAGNOSIS — O10919 Unspecified pre-existing hypertension complicating pregnancy, unspecified trimester: Secondary | ICD-10-CM

## 2020-02-15 DIAGNOSIS — Z3A35 35 weeks gestation of pregnancy: Secondary | ICD-10-CM

## 2020-02-15 LAB — POCT URINALYSIS DIPSTICK OB
Bilirubin, UA: NEGATIVE
Blood, UA: NEGATIVE
Glucose, UA: NEGATIVE
Ketones, UA: NEGATIVE
Leukocytes, UA: NEGATIVE
Nitrite, UA: NEGATIVE
POC,PROTEIN,UA: NEGATIVE
Spec Grav, UA: 1.01 (ref 1.010–1.025)
Urobilinogen, UA: 0.2 E.U./dL
pH, UA: 5 (ref 5.0–8.0)

## 2020-02-15 NOTE — Telephone Encounter (Signed)
Courtney from MFM scheduled this pt for 10/28 at 4:00pm. I called the pt and informed her that I got a message stating that she will need another appointment at MFM. I informed her of the day and time. The pt verbally understood and I told her we will see her today for her appointment with [place].

## 2020-02-15 NOTE — Telephone Encounter (Signed)
mychart Message sent to patient.

## 2020-02-15 NOTE — Progress Notes (Signed)
NST INTERPRETATION:  Indications: intrauterine growth retardation, chronic hypertension on medications Baseline: 130 Moderate vairaibility Accelerations present Decelerations negative  No ctx.    Impression: reactive   Plan: MFM appointment on Thursday, NST on Monday. Induction aat 37 wks.     Doreene Burke, CNM

## 2020-02-15 NOTE — Patient Instructions (Signed)
Fetal Movement Counts Patient Name: ________________________________________________ Patient Due Date: ____________________ What is a fetal movement count?  A fetal movement count is the number of times that you feel your baby move during a certain amount of time. This may also be called a fetal kick count. A fetal movement count is recommended for every pregnant woman. You may be asked to start counting fetal movements as early as week 28 of your pregnancy. Pay attention to when your baby is most active. You may notice your baby's sleep and wake cycles. You may also notice things that make your baby move more. You should do a fetal movement count:  When your baby is normally most active.  At the same time each day. A good time to count movements is while you are resting, after having something to eat and drink. How do I count fetal movements? 1. Find a quiet, comfortable area. Sit, or lie down on your side. 2. Write down the date, the start time and stop time, and the number of movements that you felt between those two times. Take this information with you to your health care visits. 3. Write down your start time when you feel the first movement. 4. Count kicks, flutters, swishes, rolls, and jabs. You should feel at least 10 movements. 5. You may stop counting after you have felt 10 movements, or if you have been counting for 2 hours. Write down the stop time. 6. If you do not feel 10 movements in 2 hours, contact your health care provider for further instructions. Your health care provider may want to do additional tests to assess your baby's well-being. Contact a health care provider if:  You feel fewer than 10 movements in 2 hours.  Your baby is not moving like he or she usually does. Date: ____________ Start time: ____________ Stop time: ____________ Movements: ____________ Date: ____________ Start time: ____________ Stop time: ____________ Movements: ____________ Date: ____________  Start time: ____________ Stop time: ____________ Movements: ____________ Date: ____________ Start time: ____________ Stop time: ____________ Movements: ____________ Date: ____________ Start time: ____________ Stop time: ____________ Movements: ____________ Date: ____________ Start time: ____________ Stop time: ____________ Movements: ____________ Date: ____________ Start time: ____________ Stop time: ____________ Movements: ____________ Date: ____________ Start time: ____________ Stop time: ____________ Movements: ____________ Date: ____________ Start time: ____________ Stop time: ____________ Movements: ____________ This information is not intended to replace advice given to you by your health care provider. Make sure you discuss any questions you have with your health care provider. Document Revised: 11/27/2018 Document Reviewed: 11/27/2018 Elsevier Patient Education  2020 [employer]  

## 2020-02-16 ENCOUNTER — Telehealth: Payer: Self-pay

## 2020-02-16 NOTE — Telephone Encounter (Signed)
mychart message sent to patient

## 2020-02-18 ENCOUNTER — Ambulatory Visit (HOSPITAL_BASED_OUTPATIENT_CLINIC_OR_DEPARTMENT_OTHER): Payer: 59

## 2020-02-18 ENCOUNTER — Other Ambulatory Visit: Payer: Self-pay | Admitting: Certified Nurse Midwife

## 2020-02-18 ENCOUNTER — Encounter: Payer: Self-pay | Admitting: Obstetrics and Gynecology

## 2020-02-18 ENCOUNTER — Observation Stay
Admission: RE | Admit: 2020-02-18 | Discharge: 2020-02-18 | Disposition: A | Discharge status: Home / Self Care | Payer: 59 | Source: Home / Self Care | Attending: Certified Nurse Midwife | Admitting: Certified Nurse Midwife

## 2020-02-18 ENCOUNTER — Other Ambulatory Visit: Payer: Self-pay

## 2020-02-18 ENCOUNTER — Inpatient Hospital Stay: Payer: 59

## 2020-02-18 DIAGNOSIS — O99340 Other mental disorders complicating pregnancy, unspecified trimester: Secondary | ICD-10-CM

## 2020-02-18 DIAGNOSIS — O365930 Maternal care for other known or suspected poor fetal growth, third trimester, not applicable or unspecified: Secondary | ICD-10-CM | POA: Insufficient documentation

## 2020-02-18 DIAGNOSIS — O10919 Unspecified pre-existing hypertension complicating pregnancy, unspecified trimester: Secondary | ICD-10-CM

## 2020-02-18 DIAGNOSIS — Z3A36 36 weeks gestation of pregnancy: Secondary | ICD-10-CM

## 2020-02-18 DIAGNOSIS — O133 Gestational [pregnancy-induced] hypertension without significant proteinuria, third trimester: Secondary | ICD-10-CM | POA: Diagnosis not present

## 2020-02-18 DIAGNOSIS — F419 Anxiety disorder, unspecified: Secondary | ICD-10-CM

## 2020-02-18 DIAGNOSIS — O365990 Maternal care for other known or suspected poor fetal growth, unspecified trimester, not applicable or unspecified: Secondary | ICD-10-CM | POA: Insufficient documentation

## 2020-02-18 DIAGNOSIS — Z3A39 39 weeks gestation of pregnancy: Secondary | ICD-10-CM | POA: Insufficient documentation

## 2020-02-18 NOTE — OB Triage Note (Signed)
    L&D OB Triage Note  SUBJECTIVE Connie Webster is a 39 y.o. [id number] female at [redacted]w[redacted]d, EDD Estimated Date of Delivery: 03/16/20 who presented to triage for prolonged monitoring and repeat Biophysical profile due to [facility] 4/8 at MFM appointment today.   OB History  Gravida Para Term Preterm AB Living  4 2 2  0 1 2  SAB TAB Ectopic Multiple Live Births  1 0 0 0 2    # Outcome Date GA Lbr Len/2nd Weight Sex Delivery Anes PTL Lv  4 Current           3 Term 04/18/17 [redacted]w[redacted]d / 02:05 2905 g F Vag-Spont EPI  LIV     Name: NABEEHA, BADERTSCHER     Apgar1: 7  Apgar5: 8  2 Term 06/12/07 [redacted]w[redacted]d  3232 g F Vag-Spont   LIV  1 SAB 2007 [redacted]w[redacted]d           Medications Prior to Admission  Medication Sig Dispense Refill Last Dose  . albuterol (PROVENTIL HFA;VENTOLIN HFA) 108 (90 BASE) MCG/ACT inhaler Inhale 2 puffs into the lungs every 4 (four) hours as needed for wheezing or shortness of breath.   02/17/2020 at Unknown time  . aspirin EC 81 MG tablet Take 1 tablet (81 mg total) by mouth daily. Take after 12 weeks for prevention of preeclampssia later in pregnancy 300 tablet 2 02/18/2020 at 1200  . folic acid (FOLVITE) 1 MG tablet Take 1 mg by mouth daily.    02/18/2020 at Unknown time  . Multiple Vitamin (MULTIVITAMIN) tablet Take 1 tablet by mouth daily.   02/18/2020 at Unknown time  . NIFEdipine (PROCARDIA XL) 30 MG 24 hr tablet Take 2 tablets (60 mg total) by mouth daily. 30 tablet 5 02/18/2020 at 1200     OBJECTIVE  Nursing Evaluation:   BP 125/70 (BP Location: Right Arm)   Pulse 83   Temp 97.9 F (36.6 C)   Resp 16   Ht 5' 3.25" (1.607 m)   Wt 77.5 kg   LMP 06/02/2019 (Exact Date)   BMI 30.03 kg/m    Findings:        Reactive NST      NST was performed and has been reviewed by me.  NST INTERPRETATION: Category I  Mode: External Baseline Rate (A): 120 bpm Variability: Moderate Accelerations: 15 x 15 Decelerations: None     Contraction Frequency (min): 3-4,  ui  ASSESSMENT Impression:  1.  Pregnancy:  07/31/2019 at [redacted]w[redacted]d , EDD Estimated Date of Delivery: 03/16/20 2.  Reassuring fetal and maternal status 3.   BPP 8/8  PLAN 1. Discussed current condition and above findings with patient and reassurance given.  All questions answered. 2. Discharge home with standard labor precautions given to return to L&D or call the office for problems. 3.Return for induction on Saturday per MFM recommendations 4.Dr. Sunday attending MD in agreement with Plan.   Logan Bores, CNM

## 2020-02-18 NOTE — Discharge Instructions (Signed)

## 2020-02-18 NOTE — Progress Notes (Signed)
Pt had an [place] scheduled today with [facility] @ [facility].  BPP 4/8.  Dr. Grace Bushy requested to have pt sent to [facility] for further monitoring.  Report given to Vinnie Level, RN. Pt and sig other transported to OBS #1 via wheelchair.  Dr. Grace Bushy spoke with Dr. Logan Bores via telephone about pt Plan of Care.

## 2020-02-18 NOTE — OB Triage Note (Signed)
Pt presents from MFM for BPP 4/8.

## 2020-02-19 ENCOUNTER — Other Ambulatory Visit: Payer: Self-pay | Admitting: Certified Nurse Midwife

## 2020-02-20 ENCOUNTER — Inpatient Hospital Stay: Payer: 59 | Admitting: Anesthesiology

## 2020-02-20 ENCOUNTER — Other Ambulatory Visit: Payer: Self-pay

## 2020-02-20 ENCOUNTER — Inpatient Hospital Stay
Admission: RE | Admit: 2020-02-20 | Discharge: 2020-02-22 | DRG: 797 | Disposition: A | Discharge status: Home / Self Care | Payer: 59 | Attending: Certified Nurse Midwife | Admitting: Certified Nurse Midwife

## 2020-02-20 ENCOUNTER — Encounter: Payer: Self-pay | Admitting: Certified Nurse Midwife

## 2020-02-20 DIAGNOSIS — O1002 Pre-existing essential hypertension complicating childbirth: Secondary | ICD-10-CM | POA: Diagnosis present

## 2020-02-20 DIAGNOSIS — O9952 Diseases of the respiratory system complicating childbirth: Secondary | ICD-10-CM | POA: Diagnosis present

## 2020-02-20 DIAGNOSIS — O10913 Unspecified pre-existing hypertension complicating pregnancy, third trimester: Secondary | ICD-10-CM | POA: Diagnosis not present

## 2020-02-20 DIAGNOSIS — O365930 Maternal care for other known or suspected poor fetal growth, third trimester, not applicable or unspecified: Principal | ICD-10-CM

## 2020-02-20 DIAGNOSIS — J45909 Unspecified asthma, uncomplicated: Secondary | ICD-10-CM | POA: Diagnosis present

## 2020-02-20 DIAGNOSIS — Z20822 Contact with and (suspected) exposure to covid-19: Secondary | ICD-10-CM | POA: Diagnosis present

## 2020-02-20 DIAGNOSIS — Z302 Encounter for sterilization: Secondary | ICD-10-CM

## 2020-02-20 DIAGNOSIS — Z3A36 36 weeks gestation of pregnancy: Secondary | ICD-10-CM | POA: Diagnosis not present

## 2020-02-20 DIAGNOSIS — O09523 Supervision of elderly multigravida, third trimester: Secondary | ICD-10-CM

## 2020-02-20 DIAGNOSIS — O10919 Unspecified pre-existing hypertension complicating pregnancy, unspecified trimester: Secondary | ICD-10-CM

## 2020-02-20 DIAGNOSIS — O99340 Other mental disorders complicating pregnancy, unspecified trimester: Secondary | ICD-10-CM

## 2020-02-20 DIAGNOSIS — F419 Anxiety disorder, unspecified: Secondary | ICD-10-CM

## 2020-02-20 LAB — PROTEIN / CREATININE RATIO, URINE
Creatinine, Urine: 149 mg/dL
Protein Creatinine Ratio: 0.19 mg/mg{Cre} — ABNORMAL HIGH (ref 0.00–0.15)
Total Protein, Urine: 28 mg/dL

## 2020-02-20 LAB — CBC
HCT: 36.1 % (ref 36.0–46.0)
Hemoglobin: 11.8 g/dL — ABNORMAL LOW (ref 12.0–15.0)
MCH: 29.1 pg (ref 26.0–34.0)
MCHC: 32.7 g/dL (ref 30.0–36.0)
MCV: 88.9 fL (ref 80.0–100.0)
Platelets: 350 10*3/uL (ref 150–400)
RBC: 4.06 MIL/uL (ref 3.87–5.11)
RDW: 14.5 % (ref 11.5–15.5)
WBC: 17.7 10*3/uL — ABNORMAL HIGH (ref 4.0–10.5)
nRBC: 0 % (ref 0.0–0.2)

## 2020-02-20 LAB — RESPIRATORY PANEL BY RT PCR (FLU A&B, COVID)
Influenza A by PCR: NEGATIVE
Influenza B by PCR: NEGATIVE
SARS Coronavirus 2 by RT PCR: NEGATIVE

## 2020-02-20 LAB — TYPE AND SCREEN
ABO/RH(D): O POS
Antibody Screen: NEGATIVE

## 2020-02-20 MED ORDER — NIFEDIPINE ER OSMOTIC RELEASE 30 MG PO TB24: 60.0000 mg | ORAL_TABLET | Freq: Every day | ORAL | Status: DC

## 2020-02-20 MED ORDER — TERBUTALINE SULFATE 1 MG/ML IJ SOLN: 0.2500 mg | Freq: Once | INTRAMUSCULAR | Status: DC | PRN

## 2020-02-20 MED ORDER — OXYTOCIN-SODIUM CHLORIDE 30-0.9 UT/500ML-% IV SOLN: 1.0000 m[IU]/min | INTRAVENOUS | Status: DC

## 2020-02-20 MED ORDER — COCONUT OIL OIL: 1.0000 "application " | TOPICAL_OIL | Status: DC | PRN

## 2020-02-20 MED ORDER — LIDOCAINE HCL (PF) 1 % IJ SOLN
INTRAMUSCULAR | Status: AC
  Filled 2020-02-20: qty 30

## 2020-02-20 MED ORDER — PHENYLEPHRINE 40 MCG/ML (10ML) SYRINGE FOR IV PUSH (FOR BLOOD PRESSURE SUPPORT)
80.0000 ug | PREFILLED_SYRINGE | INTRAVENOUS | Status: DC | PRN
  Filled 2020-02-20: qty 10

## 2020-02-20 MED ORDER — FENTANYL 2.5 MCG/ML W/ROPIVACAINE 0.15% IN NS 100 ML EPIDURAL (ARMC)
EPIDURAL | Status: DC | PRN
  Administered 2020-02-20: 2 mL/h via EPIDURAL

## 2020-02-20 MED ORDER — TETANUS-DIPHTH-ACELL PERTUSSIS 5-2.5-18.5 LF-MCG/0.5 IM SUSY
0.5000 mL | PREFILLED_SYRINGE | Freq: Once | INTRAMUSCULAR | Status: DC
  Filled 2020-02-20: qty 0.5

## 2020-02-20 MED ORDER — OXYTOCIN-SODIUM CHLORIDE 30-0.9 UT/500ML-% IV SOLN
2.5000 [IU]/h | INTRAVENOUS | Status: DC
  Administered 2020-02-20: 2.5 [IU]/h via INTRAVENOUS
  Filled 2020-02-20: qty 500

## 2020-02-20 MED ORDER — SIMETHICONE 80 MG PO CHEW: 80.0000 mg | CHEWABLE_TABLET | ORAL | Status: DC | PRN

## 2020-02-20 MED ORDER — LACTATED RINGERS IV SOLN
500.0000 mL | Freq: Once | INTRAVENOUS | Status: AC
  Administered 2020-02-20: 500 mL via INTRAVENOUS

## 2020-02-20 MED ORDER — OXYTOCIN 10 UNIT/ML IJ SOLN
INTRAMUSCULAR | Status: AC
  Filled 2020-02-20: qty 2

## 2020-02-20 MED ORDER — LIDOCAINE-EPINEPHRINE (PF) 1.5 %-1:200000 IJ SOLN
INTRAMUSCULAR | Status: DC | PRN
  Administered 2020-02-20: 3 mL via EPIDURAL

## 2020-02-20 MED ORDER — OXYTOCIN BOLUS FROM INFUSION
333.0000 mL | Freq: Once | INTRAVENOUS | Status: AC
  Administered 2020-02-20: 333 mL via INTRAVENOUS

## 2020-02-20 MED ORDER — OXYCODONE-ACETAMINOPHEN 5-325 MG PO TABS
2.0000 | ORAL_TABLET | ORAL | Status: DC | PRN
  Administered 2020-02-21 – 2020-02-22 (×4): 2 via ORAL
  Filled 2020-02-20 (×4): qty 2

## 2020-02-20 MED ORDER — SENNOSIDES-DOCUSATE SODIUM 8.6-50 MG PO TABS
2.0000 | ORAL_TABLET | ORAL | Status: DC
  Administered 2020-02-21 – 2020-02-22 (×2): 2 via ORAL
  Filled 2020-02-20 (×2): qty 2

## 2020-02-20 MED ORDER — SOD CITRATE-CITRIC ACID 500-334 MG/5ML PO SOLN: 30.0000 mL | ORAL | Status: DC | PRN

## 2020-02-20 MED ORDER — DIBUCAINE (PERIANAL) 1 % EX OINT: 1.0000 "application " | TOPICAL_OINTMENT | CUTANEOUS | Status: DC | PRN

## 2020-02-20 MED ORDER — FERROUS SULFATE 325 (65 FE) MG PO TABS
325.0000 mg | ORAL_TABLET | Freq: Every day | ORAL | Status: DC
  Administered 2020-02-21 – 2020-02-22 (×2): 325 mg via ORAL
  Filled 2020-02-20 (×2): qty 1

## 2020-02-20 MED ORDER — LACTATED RINGERS IV SOLN: 500.0000 mL | INTRAVENOUS | Status: DC | PRN

## 2020-02-20 MED ORDER — ONDANSETRON HCL 4 MG/2ML IJ SOLN: 4.0000 mg | INTRAMUSCULAR | Status: DC | PRN

## 2020-02-20 MED ORDER — PRENATAL MULTIVITAMIN CH
1.0000 | ORAL_TABLET | Freq: Every day | ORAL | Status: DC
  Administered 2020-02-21 – 2020-02-22 (×2): 1 via ORAL
  Filled 2020-02-20 (×2): qty 1

## 2020-02-20 MED ORDER — WITCH HAZEL-GLYCERIN EX PADS: 1.0000 "application " | MEDICATED_PAD | CUTANEOUS | Status: DC | PRN

## 2020-02-20 MED ORDER — IBUPROFEN 600 MG PO TABS
600.0000 mg | ORAL_TABLET | Freq: Four times a day (QID) | ORAL | Status: DC
  Administered 2020-02-20 – 2020-02-22 (×5): 600 mg via ORAL
  Filled 2020-02-20 (×5): qty 1

## 2020-02-20 MED ORDER — EPHEDRINE 5 MG/ML INJ
10.0000 mg | INTRAVENOUS | Status: DC | PRN
  Filled 2020-02-20: qty 2

## 2020-02-20 MED ORDER — NIFEDIPINE ER OSMOTIC RELEASE 30 MG PO TB24
30.0000 mg | ORAL_TABLET | Freq: Every day | ORAL | Status: DC
  Administered 2020-02-20 – 2020-02-22 (×3): 30 mg via ORAL
  Filled 2020-02-20 (×2): qty 1

## 2020-02-20 MED ORDER — NIFEDIPINE ER OSMOTIC RELEASE 30 MG PO TB24
ORAL_TABLET | ORAL | Status: AC
  Filled 2020-02-20: qty 1

## 2020-02-20 MED ORDER — BUTORPHANOL TARTRATE 1 MG/ML IJ SOLN: 1.0000 mg | INTRAMUSCULAR | Status: DC | PRN

## 2020-02-20 MED ORDER — LIDOCAINE HCL (PF) 1 % IJ SOLN
INTRAMUSCULAR | Status: DC | PRN
  Administered 2020-02-20: 1.5 mL

## 2020-02-20 MED ORDER — LIDOCAINE HCL (PF) 1 % IJ SOLN: 30.0000 mL | INTRAMUSCULAR | Status: DC | PRN

## 2020-02-20 MED ORDER — DOCUSATE SODIUM 100 MG PO CAPS
100.0000 mg | ORAL_CAPSULE | Freq: Two times a day (BID) | ORAL | Status: DC
  Administered 2020-02-20 – 2020-02-22 (×4): 100 mg via ORAL
  Filled 2020-02-20 (×4): qty 1

## 2020-02-20 MED ORDER — TERBUTALINE SULFATE 1 MG/ML IJ SOLN
0.2500 mg | Freq: Once | INTRAMUSCULAR | Status: DC | PRN
  Filled 2020-02-20: qty 1

## 2020-02-20 MED ORDER — OXYTOCIN-SODIUM CHLORIDE 30-0.9 UT/500ML-% IV SOLN
INTRAVENOUS | Status: AC
  Filled 2020-02-20: qty 500

## 2020-02-20 MED ORDER — BENZOCAINE-MENTHOL 20-0.5 % EX AERO: 1.0000 "application " | INHALATION_SPRAY | CUTANEOUS | Status: DC | PRN

## 2020-02-20 MED ORDER — FENTANYL 2.5 MCG/ML W/ROPIVACAINE 0.15% IN NS 100 ML EPIDURAL (ARMC): 12.0000 mL/h | EPIDURAL | Status: DC

## 2020-02-20 MED ORDER — MISOPROSTOL 50MCG HALF TABLET
50.0000 ug | ORAL_TABLET | ORAL | Status: DC
  Administered 2020-02-20: 50 ug via VAGINAL
  Filled 2020-02-20 (×2): qty 1

## 2020-02-20 MED ORDER — FENTANYL 2.5 MCG/ML W/ROPIVACAINE 0.15% IN NS 100 ML EPIDURAL (ARMC)
EPIDURAL | Status: AC
  Filled 2020-02-20: qty 100

## 2020-02-20 MED ORDER — LACTATED RINGERS IV SOLN: INTRAVENOUS | Status: DC

## 2020-02-20 MED ORDER — ACETAMINOPHEN 325 MG PO TABS: 650.0000 mg | ORAL_TABLET | ORAL | Status: DC | PRN

## 2020-02-20 MED ORDER — ONDANSETRON HCL 4 MG PO TABS
4.0000 mg | ORAL_TABLET | ORAL | Status: DC | PRN
  Filled 2020-02-20: qty 1

## 2020-02-20 MED ORDER — MISOPROSTOL 200 MCG PO TABS
ORAL_TABLET | ORAL | Status: AC
  Administered 2020-02-20: 50 ug via VAGINAL
  Filled 2020-02-20: qty 4

## 2020-02-20 MED ORDER — DIPHENHYDRAMINE HCL 50 MG/ML IJ SOLN: 12.5000 mg | INTRAMUSCULAR | Status: DC | PRN

## 2020-02-20 MED ORDER — AMMONIA AROMATIC IN INHA
RESPIRATORY_TRACT | Status: AC
  Filled 2020-02-20: qty 10

## 2020-02-20 MED ORDER — OXYCODONE-ACETAMINOPHEN 5-325 MG PO TABS: 1.0000 | ORAL_TABLET | ORAL | Status: DC | PRN

## 2020-02-20 MED ORDER — ONDANSETRON HCL 4 MG/2ML IJ SOLN: 4.0000 mg | Freq: Four times a day (QID) | INTRAMUSCULAR | Status: DC | PRN

## 2020-02-20 NOTE — Progress Notes (Signed)
Lactation called to bedside to help initiate feeding; infant skin-to-skin.

## 2020-02-20 NOTE — Anesthesia Preprocedure Evaluation (Signed)
Anesthesia Evaluation  Patient identified by MRN, date of birth, ID band Patient awake    Reviewed: Allergy & Precautions, NPO status , Patient's Chart, lab work & pertinent test results  History of Anesthesia Complications Negative for: history of anesthetic complications  Airway Mallampati: II       Dental   Pulmonary asthma , neg COPD, Not current smoker,           Cardiovascular hypertension (gestational), (-) Past MI and (-) CHF (-) dysrhythmias (-) Valvular Problems/Murmurs     Neuro/Psych Anxiety Depression    GI/Hepatic Neg liver ROS, neg GERD  ,  Endo/Other  diabetes  Renal/GU      Musculoskeletal   Abdominal   Peds  Hematology   Anesthesia Other Findings   Reproductive/Obstetrics (+) Pregnancy                             Anesthesia Physical Anesthesia Plan  ASA: II  Anesthesia Plan: Epidural   Post-op Pain Management:    Induction:   PONV Risk Score and Plan:   Airway Management Planned:   Additional Equipment:   Intra-op Plan:   Post-operative Plan:   Informed Consent: I have reviewed the patients History and Physical, chart, labs and discussed the procedure including the risks, benefits and alternatives for the proposed anesthesia with the patient or authorized representative who has indicated his/her understanding and acceptance.       Plan Discussed with:   Anesthesia Plan Comments:         Anesthesia Quick Evaluation

## 2020-02-20 NOTE — H&P (Signed)
History and Physical   HPI  Connie Webster is a 39 y.o. [id number] at [redacted]w[redacted]d Estimated Date of Delivery: 03/16/20 who is being admitted for induction of labor due to IUGR and chronic hypertension on medications.    OB History  OB History  Gravida Para Term Preterm AB Living  4 2 2  0 1 2  SAB TAB Ectopic Multiple Live Births  1 0 0 0 2    # Outcome Date GA Lbr Len/2nd Weight Sex Delivery Anes PTL Lv  4 Current           3 Term 04/18/17 [redacted]w[redacted]d / 02:05 2905 g F Vag-Spont EPI  LIV     Name: DOREE, KUEHNE     Apgar1: 7  Apgar5: 8  2 Term 06/12/07 [redacted]w[redacted]d  3232 g F Vag-Spont   LIV  1 SAB 2007 [redacted]w[redacted]d           PROBLEM LIST  Pregnancy complications or risks: Patient Active Problem List   Diagnosis Date Noted  . Labor and delivery, indication for care 02/11/2020  . Vitamin D deficiency 09/27/2019  . History of UTI 09/25/2019  . Anxiety in pregnancy, antepartum 09/25/2019  . Chronic hypertension during pregnancy, antepartum 02/12/2019  . History of gestational diabetes mellitus (GDM) 02/12/2019  . Gestational diabetes mellitus (GDM) 02/06/2017  . Umbilical hernia 02/05/2017    Prenatal labs and studies: ABO, Rh: --/--/O POS (10/30 0503) Antibody: NEG (10/30 0503) Rubella: 2.04 (04/19 0928) RPR: Non Reactive (08/30 0957)  HBsAg: Negative (04/19 0928)  HIV: Non Reactive (04/19 04-09-1994)  6834-- (10/19 1521)   Past Medical History:  Diagnosis Date  . Anxiety   . Asthma    frequently uses inhaler  . Depression    takes meds  . Elevated blood pressure affecting pregnancy, antepartum 03/06/2017  . Gestational diabetes    diet controlled  . Gestational diabetes   . Hypertension    gestational   . Umbilical hernia      Past Surgical History:  Procedure Laterality Date  . DILATION AND CURETTAGE OF UTERUS       Medications    Current Discharge Medication List    CONTINUE these medications which have NOT CHANGED   Details   albuterol (PROVENTIL HFA;VENTOLIN HFA) 108 (90 BASE) MCG/ACT inhaler Inhale 2 puffs into the lungs every 4 (four) hours as needed for wheezing or shortness of breath.    aspirin EC 81 MG tablet Take 1 tablet (81 mg total) by mouth daily. Take after 12 weeks for prevention of preeclampssia later in pregnancy Qty: 300 tablet, Refills: 2    folic acid (FOLVITE) 1 MG tablet Take 1 mg by mouth daily.     Multiple Vitamin (MULTIVITAMIN) tablet Take 1 tablet by mouth daily.    NIFEdipine (PROCARDIA XL) 30 MG 24 hr tablet Take 2 tablets (60 mg total) by mouth daily. Qty: 30 tablet, Refills: 5         Allergies  Peanut-containing drug products  Review of Systems  Constitutional: negative Eyes: negative Ears, nose, mouth, throat, and face: negative Respiratory: negative Cardiovascular: negative Gastrointestinal: negative Genitourinary:negative Integument/breast: negative Hematologic/lymphatic: negative Musculoskeletal:negative Neurological: negative Behavioral/Psych: negative Endocrine: negative Allergic/Immunologic: negative  Physical Exam  BP (!) 148/96   Pulse 89   Temp 98.3 F (36.8 C) (Oral)   Resp 19   Ht 5' 3.75" (1.619 m)   Wt 77.5 kg   LMP 06/02/2019 (Exact Date)   SpO2 100%   BMI 29.57 kg/m  Lungs:  CTA B Cardio: RRR  Abd: Soft, gravid, NT Presentation: cephalic EXT: No C/C/ 0 Edema DTRs: 2+ B CERVIX: Dilation: 1 Effacement (%): 50 Cervical Position: Anterior Station: -2 Presentation: Vertex Exam by:: Laural Benes RN  See Prenatal records for more detailed PE.     FHR:  Baseline: 130 bpm, Variability: Good {> 6 bpm), Accelerations: Reactive and Decelerations: Absent  Toco: Uterine Contractions: Frequency: Every 3-4 minutes and Intensity: mild  Test Results  Results for orders placed or performed during the hospital encounter of 02/20/20 (from the past 24 hour(s))  CBC     Status: Abnormal   Collection Time: 02/20/20  5:03 AM  Result  Value Ref Range   WBC 17.7 (H) 4.0 - 10.5 K/uL   RBC 4.06 3.87 - 5.11 MIL/uL   Hemoglobin 11.8 (L) 12.0 - 15.0 g/dL   HCT 18.2 36 - 46 %   MCV 88.9 80.0 - 100.0 fL   MCH 29.1 26.0 - 34.0 pg   MCHC 32.7 30.0 - 36.0 g/dL   RDW 99.3 71.6 - 96.7 %   Platelets 350 150 - 400 K/uL   nRBC 0.0 0.0 - 0.2 %  Type and screen     Status: None   Collection Time: 02/20/20  5:03 AM  Result Value Ref Range   ABO/RH(D) O POS    Antibody Screen NEG    Sample Expiration      02/23/2020,2359 Performed at [facility], [address] [place], [place] [address]   Respiratory Panel by RT PCR (Flu A&B, Covid) - Nasopharyngeal Swab     Status: None   Collection Time: 02/20/20  5:07 AM   Specimen: Nasopharyngeal Swab  Result Value Ref Range   SARS Coronavirus 2 by RT PCR NEGATIVE NEGATIVE   Influenza A by PCR NEGATIVE NEGATIVE   Influenza B by PCR NEGATIVE NEGATIVE  Protein / creatinine ratio, urine     Status: Abnormal   Collection Time: 02/20/20  7:38 AM  Result Value Ref Range   Creatinine, Urine 149 mg/dL   Total Protein, Urine 28 mg/dL   Protein Creatinine Ratio 0.19 (H) 0.00 - 0.15 mg/mg[Cre]   Group B Strep negative  Assessment   G4P2012 at [redacted]w[redacted]d Estimated Date of Delivery: 03/16/20  The fetus is reassuring.   Patient Active Problem List   Diagnosis Date Noted  . Labor and delivery, indication for care 02/11/2020  . Vitamin D deficiency 09/27/2019  . History of UTI 09/25/2019  . Anxiety in pregnancy, antepartum 09/25/2019  . Chronic hypertension during pregnancy, antepartum 02/12/2019  . History of gestational diabetes mellitus (GDM) 02/12/2019  . Gestational diabetes mellitus (GDM) 02/06/2017  . Umbilical hernia 02/05/2017    Plan  1. Admitted to L&D  2. EFM:-- Category 1 3. Stadol or Epidural if desired.   4. Admission labs completed 5. Dr. Valentino Saxon notified  6. Anticipate NSVD  Doreene Burke, CNM  02/20/2020 9:38 AM

## 2020-02-20 NOTE — Progress Notes (Signed)
LABOR NOTE   Connie Webster 39 y.o.@ at [redacted]w[redacted]d  SUBJECTIVE:  Comfortable with Epidural . Not feeling any pressure at this time.  Analgesia: Epidural  OBJECTIVE:  BP 136/83   Pulse 88   Temp 98.3 F (36.8 C) (Oral)   Resp 19   Ht 5' 3.75" (1.619 m)   Wt 77.5 kg   LMP 06/02/2019 (Exact Date)   SpO2 99%   BMI 29.57 kg/m  No intake/output data recorded.  She has shown cervical change. CERVIX: 5cm:  Per RN exam SVE:   Dilation: 3 Effacement (%): 60 Station: -2 Exam by:: Janee Morn, CNM CONTRACTIONS: irregular, every 2 minutes FHR: Fetal heart tracing reviewed. Baseline: 150 bpm, Variability: Good {> 6 bpm), Accelerations: Reactive and Decelerations: deep variables while on back with foley placement Category II    Labs: Lab Results  Component Value Date   WBC 17.7 (H) 02/20/2020   HGB 11.8 (L) 02/20/2020   HCT 36.1 02/20/2020   MCV 88.9 02/20/2020   PLT 350 02/20/2020    ASSESSMENT: 1) Labor curve reviewed.       Progress: Active phase labor.     Membranes: intact           Active Problems:   Labor and delivery, indication for care IUGR Chronic Hypertension  PLAN: continue present management   Doreene Burke, CNM  02/20/2020 1:46 PM

## 2020-02-20 NOTE — Anesthesia Procedure Notes (Signed)
Epidural Patient location during procedure: OB Start time: 02/20/2020 10:55 AM End time: 02/20/2020 11:18 AM  Staffing Performed: anesthesiologist   Preanesthetic Checklist Completed: patient identified, IV checked, site marked, risks and benefits discussed, surgical consent, monitors and equipment checked, pre-op evaluation and timeout performed  Epidural Patient position: sitting Prep: Betadine Patient monitoring: heart rate, continuous pulse ox and blood pressure Approach: midline Location: L4-L5 Injection technique: LOR saline  Needle:  Needle type: Tuohy  Needle gauge: 18 G Needle length: 9 cm and 9 Needle insertion depth: 8 cm Catheter type: closed end flexible Catheter size: 19 Gauge Catheter at skin depth: 13 cm Test dose: negative and 1.5% lidocaine with Epi 1:200 K  Assessment Events: blood not aspirated, injection not painful, no injection resistance, no paresthesia and negative IV test  Additional Notes   Patient tolerated the insertion well without complications.Reason for block:procedure for pain

## 2020-02-21 ENCOUNTER — Inpatient Hospital Stay: Payer: 59 | Admitting: Anesthesiology

## 2020-02-21 ENCOUNTER — Encounter
Admission: RE | Disposition: A | Discharge status: Home / Self Care | Payer: Self-pay | Source: Home / Self Care | Attending: Certified Nurse Midwife

## 2020-02-21 DIAGNOSIS — Z302 Encounter for sterilization: Secondary | ICD-10-CM | POA: Diagnosis not present

## 2020-02-21 HISTORY — PX: TUBAL LIGATION: SHX77

## 2020-02-21 LAB — CBC
HCT: 32.6 % — ABNORMAL LOW (ref 36.0–46.0)
Hemoglobin: 10.8 g/dL — ABNORMAL LOW (ref 12.0–15.0)
MCH: 29.6 pg (ref 26.0–34.0)
MCHC: 33.1 g/dL (ref 30.0–36.0)
MCV: 89.3 fL (ref 80.0–100.0)
Platelets: 286 10*3/uL (ref 150–400)
RBC: 3.65 MIL/uL — ABNORMAL LOW (ref 3.87–5.11)
RDW: 14.6 % (ref 11.5–15.5)
WBC: 21.9 10*3/uL — ABNORMAL HIGH (ref 4.0–10.5)
nRBC: 0 % (ref 0.0–0.2)

## 2020-02-21 LAB — RPR: RPR Ser Ql: NONREACTIVE

## 2020-02-21 SURGERY — LIGATION, FALLOPIAN TUBE, POSTPARTUM
Anesthesia: General | Site: Abdomen | Laterality: Bilateral

## 2020-02-21 MED ORDER — SUGAMMADEX SODIUM 200 MG/2ML IV SOLN
INTRAVENOUS | Status: DC | PRN
  Administered 2020-02-21: 170 mg via INTRAVENOUS

## 2020-02-21 MED ORDER — FAMOTIDINE 20 MG PO TABS
40.0000 mg | ORAL_TABLET | Freq: Once | ORAL | Status: AC
  Administered 2020-02-21: 40 mg via ORAL
  Filled 2020-02-21: qty 2

## 2020-02-21 MED ORDER — FENTANYL CITRATE (PF) 100 MCG/2ML IJ SOLN: 25.0000 ug | INTRAMUSCULAR | Status: DC | PRN

## 2020-02-21 MED ORDER — KETOROLAC TROMETHAMINE 30 MG/ML IJ SOLN
INTRAMUSCULAR | Status: DC | PRN
  Administered 2020-02-21: 30 mg via INTRAVENOUS

## 2020-02-21 MED ORDER — DEXAMETHASONE SODIUM PHOSPHATE 10 MG/ML IJ SOLN
INTRAMUSCULAR | Status: AC
  Filled 2020-02-21: qty 1

## 2020-02-21 MED ORDER — ACETAMINOPHEN 10 MG/ML IV SOLN
INTRAVENOUS | Status: AC
  Filled 2020-02-21: qty 100

## 2020-02-21 MED ORDER — OXYCODONE HCL 5 MG PO TABS
10.0000 mg | ORAL_TABLET | Freq: Once | ORAL | Status: AC
  Administered 2020-02-21: 10 mg via ORAL
  Filled 2020-02-21: qty 2

## 2020-02-21 MED ORDER — LACTATED RINGERS IV SOLN: INTRAVENOUS | Status: DC

## 2020-02-21 MED ORDER — LIDOCAINE HCL (CARDIAC) PF 100 MG/5ML IV SOSY
PREFILLED_SYRINGE | INTRAVENOUS | Status: DC | PRN
  Administered 2020-02-21: 100 mg via INTRAVENOUS

## 2020-02-21 MED ORDER — FENTANYL CITRATE (PF) 100 MCG/2ML IJ SOLN
INTRAMUSCULAR | Status: DC | PRN
  Administered 2020-02-21 (×2): 50 ug via INTRAVENOUS

## 2020-02-21 MED ORDER — ROCURONIUM BROMIDE 100 MG/10ML IV SOLN
INTRAVENOUS | Status: DC | PRN
  Administered 2020-02-21: 30 mg via INTRAVENOUS

## 2020-02-21 MED ORDER — ROCURONIUM BROMIDE 10 MG/ML (PF) SYRINGE
PREFILLED_SYRINGE | INTRAVENOUS | Status: AC
  Filled 2020-02-21: qty 10

## 2020-02-21 MED ORDER — BUPIVACAINE HCL (PF) 0.5 % IJ SOLN
INTRAMUSCULAR | Status: AC
  Filled 2020-02-21: qty 30

## 2020-02-21 MED ORDER — PROPOFOL 10 MG/ML IV BOLUS
INTRAVENOUS | Status: AC
  Filled 2020-02-21: qty 20

## 2020-02-21 MED ORDER — ONDANSETRON HCL 4 MG/2ML IJ SOLN
INTRAMUSCULAR | Status: DC | PRN
  Administered 2020-02-21: 4 mg via INTRAVENOUS

## 2020-02-21 MED ORDER — LIDOCAINE HCL (PF) 2 % IJ SOLN
INTRAMUSCULAR | Status: AC
  Filled 2020-02-21: qty 5

## 2020-02-21 MED ORDER — PROPOFOL 10 MG/ML IV BOLUS
INTRAVENOUS | Status: DC | PRN
  Administered 2020-02-21: 150 mg via INTRAVENOUS

## 2020-02-21 MED ORDER — DEXAMETHASONE SODIUM PHOSPHATE 10 MG/ML IJ SOLN
INTRAMUSCULAR | Status: DC | PRN
  Administered 2020-02-21: 10 mg via INTRAVENOUS

## 2020-02-21 MED ORDER — SEVOFLURANE IN SOLN
RESPIRATORY_TRACT | Status: AC
  Filled 2020-02-21: qty 250

## 2020-02-21 MED ORDER — METOCLOPRAMIDE HCL 10 MG PO TABS
10.0000 mg | ORAL_TABLET | Freq: Once | ORAL | Status: AC
  Administered 2020-02-21: 10 mg via ORAL
  Filled 2020-02-21: qty 1

## 2020-02-21 MED ORDER — BUPIVACAINE HCL 0.5 % IJ SOLN
INTRAMUSCULAR | Status: DC | PRN
  Administered 2020-02-21: 7 mL
  Administered 2020-02-21: 12 mL

## 2020-02-21 MED ORDER — FENTANYL CITRATE (PF) 100 MCG/2ML IJ SOLN
INTRAMUSCULAR | Status: AC
  Filled 2020-02-21: qty 2

## 2020-02-21 MED ORDER — HYDROCODONE-ACETAMINOPHEN 5-325 MG PO TABS: 1.0000 | ORAL_TABLET | ORAL | Status: DC | PRN

## 2020-02-21 MED ORDER — ACETAMINOPHEN 10 MG/ML IV SOLN
INTRAVENOUS | Status: DC | PRN
  Administered 2020-02-21: 1000 mg via INTRAVENOUS

## 2020-02-21 MED ORDER — KETOROLAC TROMETHAMINE 30 MG/ML IJ SOLN
INTRAMUSCULAR | Status: AC
  Filled 2020-02-21: qty 1

## 2020-02-21 MED ORDER — PHENYLEPHRINE HCL (PRESSORS) 10 MG/ML IV SOLN
INTRAVENOUS | Status: DC | PRN
  Administered 2020-02-21: 100 ug via INTRAVENOUS

## 2020-02-21 MED ORDER — ONDANSETRON HCL 4 MG/2ML IJ SOLN: 4.0000 mg | Freq: Once | INTRAMUSCULAR | Status: DC | PRN

## 2020-02-21 MED ORDER — ONDANSETRON HCL 4 MG/2ML IJ SOLN
INTRAMUSCULAR | Status: AC
  Filled 2020-02-21: qty 2

## 2020-02-21 SURGICAL SUPPLY — 25 items
BLADE SURG SZ11 CARB STEEL (BLADE) ×2 IMPLANT
CHLORAPREP W/TINT 26 (MISCELLANEOUS) ×2 IMPLANT
COVER WAND RF STERILE (DRAPES) ×2 IMPLANT
DERMABOND ADVANCED (GAUZE/BANDAGES/DRESSINGS) ×1
DERMABOND ADVANCED .7 DNX12 (GAUZE/BANDAGES/DRESSINGS) ×1 IMPLANT
DRAPE LAPAROTOMY 100X77 ABD (DRAPES) ×2 IMPLANT
GLOVE BIO SURGEON STRL SZ 6.5 (GLOVE) ×2 IMPLANT
GLOVE INDICATOR 7.0 STRL GRN (GLOVE) ×2 IMPLANT
GOWN STRL REUS W/ TWL LRG LVL3 (GOWN DISPOSABLE) ×2 IMPLANT
GOWN STRL REUS W/TWL LRG LVL3 (GOWN DISPOSABLE) ×2
KIT TURNOVER CYSTO (KITS) ×2 IMPLANT
MANIFOLD NEPTUNE II (INSTRUMENTS) ×2 IMPLANT
NEEDLE HYPO 25GX1X1/2 BEV (NEEDLE) ×2 IMPLANT
NS IRRIG 500ML POUR BTL (IV SOLUTION) ×2 IMPLANT
PACK BASIN MINOR (MISCELLANEOUS) ×2 IMPLANT
SUT MNCRL 4-0 (SUTURE) ×1
SUT MNCRL 4-0 27XMFL (SUTURE) ×1
SUT PLAIN GUT 0 (SUTURE) ×4 IMPLANT
SUT VIC AB 0 CT1 36 (SUTURE) IMPLANT
SUT VIC AB 0 SH 27 (SUTURE) IMPLANT
SUT VIC AB 3-0 SH 27 (SUTURE) ×1
SUT VIC AB 3-0 SH 27X BRD (SUTURE) ×1 IMPLANT
SUT VICRYL 0 AB UR-6 (SUTURE) ×6 IMPLANT
SUTURE MNCRL 4-0 27XMF (SUTURE) ×1 IMPLANT
SYR 10ML LL (SYRINGE) ×2 IMPLANT

## 2020-02-21 NOTE — Anesthesia Postprocedure Evaluation (Signed)
Anesthesia Post Note  Patient: Music therapist  Procedure(s) Performed: AN AD HOC LABOR EPIDURAL  Patient location during evaluation: Mother Baby Anesthesia Type: Epidural Level of consciousness: awake and alert Pain management: pain level controlled Vital Signs Assessment: post-procedure vital signs reviewed and stable Respiratory status: spontaneous breathing, nonlabored ventilation and respiratory function stable Cardiovascular status: stable Postop Assessment: no headache, no backache and epidural receding Anesthetic complications: no   No complications documented.   Last Vitals:  Vitals:   02/21/20 0833 02/21/20 1144  BP: 128/86 (!) 130/91  Pulse: 85 84  Resp: 18 18  Temp: 36.7 C 36.7 C  SpO2: 97% 98%    Last Pain:  Vitals:   02/21/20 1144  TempSrc: Oral  PainSc:                  Connie Webster K

## 2020-02-21 NOTE — Op Note (Signed)
Procedure(s): POST PARTUM TUBAL LIGATION Procedure Note  Connie Webster female 39 y.o. 02/21/2020  Indications: The patient is a 39 y.o. [phone] female with undesired fertility. PPD#1 s/p NSVD.   Pre-operative Diagnosis: Multiparous female with undesired fertility, postpartum state s/p NSVD  Post-operative Diagnosis: Same  Surgeon: Hildred Laser, MD  Assistants:  Richardean Chimera, [place] PA-S.   Anesthesia: General endotracheal anesthesia  Procedure Details: The patient was seen in the Holding Room. The risks, benefits, complications, treatment options, and expected outcomes were discussed with the patient.  The patient concurred with the proposed plan, giving informed consent.  The site of surgery properly noted/marked. The patient was taken to the Operating Room, identified as Music therapist and the procedure verified as Procedure(s) (LRB): POST PARTUM TUBAL LIGATION (Bilateral).    The patient was taken to the operating room where she was placed under general anesthesia without difficulty. She was then placed in the dorsal supine position and prepped and draped in sterile fashion.   After an adequate timeout was performed, attention was turned to the patient's abdomen local analgesia was administered.  A small transverse skin incision was made under the umbilical fold. The incision was taken down to the layer of fascia using the scalpel, and fascia was incised, and extended bilaterally using Mayo scissors. The peritoneum was entered in a sharp fashion. Attention was then turned to the patient's uterus, and left fallopian tube was identified and followed out to the fimbriated end. The Babcock clamp was then used to grasp the tube approximately 4 cm from the cornual region.  A 3 cm segment of tube was then ligated with a free tie of 0-Chromic using the Parkland method and excised.  The left fallopian tube was then ligated in a similar fashion and excised. The tubal lumens  were cauterized bilaterally.  Good hemostasis was noted with bilateral fallopian tubes.  The instruments were then removed from the patient's abdomen and the fascial incision was repaired with 0 Vicryl, and injected with 8 ml of 0.5% Bupivacaine. The  skin was closed with a 4-0 Monocryl subcuticular stitch, and then injected with an additional 8 ml of 0.5% Bupivacaine. The patient tolerated the procedure well.  Instrument, sponge, and needle counts were correct times two.  The patient was then taken to the recovery room awake and in stable condition.   Findings:  Normal uterus, tubes, and ovaries.  Estimated Blood Loss:  Minimal      Drains: patient voided prior to procedure.          Total IV Fluids: 500 ml  Specimens: Segments of bilateral fallopian tubes         Implants: None         Complications:  None; patient tolerated the procedure well.         Disposition: PACU - hemodynamically stable.         Condition: stable   Hildred Laser, MD [facility]

## 2020-02-21 NOTE — Plan of Care (Signed)
  Problem: Education: Goal: Knowledge of General Education information will improve Description: Including pain rating scale, medication(s)/side effects and non-pharmacologic comfort measures Outcome: Progressing   Problem: Activity: Goal: Will verbalize the importance of balancing activity with adequate rest periods Outcome: Progressing   Problem: Activity: Goal: Ability to tolerate increased activity will improve Outcome: Progressing

## 2020-02-21 NOTE — Anesthesia Preprocedure Evaluation (Signed)
Anesthesia Evaluation  Patient identified by MRN, date of birth, ID band Patient awake    Reviewed: Allergy & Precautions, NPO status , Patient's Chart, lab work & pertinent test results  History of Anesthesia Complications Negative for: history of anesthetic complications  Airway Mallampati: II       Dental   Pulmonary asthma , neg COPD, Not current smoker,           Cardiovascular hypertension, (-) Past MI and (-) CHF (-) dysrhythmias (-) Valvular Problems/Murmurs     Neuro/Psych Anxiety Depression    GI/Hepatic Neg liver ROS, neg GERD  ,  Endo/Other  diabetes  Renal/GU      Musculoskeletal   Abdominal   Peds  Hematology   Anesthesia Other Findings   Reproductive/Obstetrics (+) Pregnancy                             Anesthesia Physical  Anesthesia Plan  ASA: II and emergent  Anesthesia Plan: General   Post-op Pain Management:    Induction:   PONV Risk Score and Plan: Ondansetron and Dexamethasone  Airway Management Planned:   Additional Equipment:   Intra-op Plan:   Post-operative Plan:   Informed Consent: I have reviewed the patients History and Physical, chart, labs and discussed the procedure including the risks, benefits and alternatives for the proposed anesthesia with the patient or authorized representative who has indicated his/her understanding and acceptance.       Plan Discussed with:   Anesthesia Plan Comments:         Anesthesia Quick Evaluation

## 2020-02-21 NOTE — Transfer of Care (Signed)
Immediate Anesthesia Transfer of Care Note  Patient: [name]  Procedure(s) Performed: POST PARTUM TUBAL LIGATION (Bilateral Abdomen)  Patient Location: PACU  Anesthesia Type:General  Level of Consciousness: drowsy and patient cooperative  Airway & Oxygen Therapy: Patient Spontanous Breathing and Patient connected to face mask oxygen  Post-op Assessment: Report given to RN and Post -op Vital signs reviewed and stable  Post vital signs: Reviewed and stable  Last Vitals:  Vitals Value Taken Time  BP 127/79 02/21/20 1313  Temp    Pulse 108 02/21/20 1313  Resp 23 02/21/20 1313  SpO2 96 % 02/21/20 1313  Vitals shown include unvalidated device data.  Last Pain:  Vitals:   02/21/20 1144  TempSrc: Oral  PainSc:          Complications: No complications documented.

## 2020-02-21 NOTE — Progress Notes (Signed)
OBSTETRICS AND GYNECOLOGY PRE-OPERATIVE NOTE   Pre-Op Diagnosis: Multiparity, desiring permanent sterilization  Planned Procedure: Postpartum tubal ligation  Surgeons: Hildred Laser, MD  Anesthesia: General  Blood:  O POS   Labs:  CBC Latest Ref Rng & Units 02/21/2020 02/20/2020 02/11/2020  WBC 4.0 - 10.5 K/uL 21.9(H) 17.7(H) 14.2(H)  Hemoglobin 12.0 - 15.0 g/dL 10.8(L) 11.8(L) 11.0(L)  Hematocrit 36 - 46 % 32.6(L) 36.1 33.7(L)  Platelets 150 - 400 K/uL 286 350 288    Lab Results  Component Value Date   ABORH O POS 02/20/2020    Antibiotics: None Needed   Consent: Signed and on chart   Pre-Op BTL Consent Consent: Surgical consent and tubal sterilization. Alternatives to sterilizations are documented on the surgical consent that has been signed by the patient. Patient confirms that she has been counseled about permanent sterilization on mutiple occasions during her antepartum course and during this admission. She understands the alternatives to permanents include: oral contraceptive pills, depot provera, patch, ring, intrauterine device (5 year or 10 year), Implanon, condoms and cervical caps/diaphram. She states that she desires the procedure.   Hildred Laser, MD [facility]

## 2020-02-21 NOTE — Anesthesia Postprocedure Evaluation (Signed)
Anesthesia Post Note  Patient: Music therapist  Procedure(s) Performed: POST PARTUM TUBAL LIGATION (Bilateral Abdomen)  Patient location during evaluation: PACU Anesthesia Type: General Level of consciousness: awake and alert Pain management: pain level controlled Vital Signs Assessment: post-procedure vital signs reviewed and stable Respiratory status: spontaneous breathing and respiratory function stable Cardiovascular status: stable Anesthetic complications: no   No complications documented.   Last Vitals:  Vitals:   02/21/20 1310 02/21/20 1313  BP:  127/79  Pulse:    Resp:  20  Temp: (P) 36.6 C   SpO2:  98%    Last Pain:  Vitals:   02/21/20 1144  TempSrc: Oral  PainSc:                  Vieno Tarrant K

## 2020-02-21 NOTE — Anesthesia Procedure Notes (Signed)
Procedure Name: Intubation Date/Time: 02/21/2020 12:16 PM Performed by: Omer Jack, CRNA Pre-anesthesia Checklist: Patient identified, Patient being monitored, Timeout performed, Emergency Drugs available and Suction available Patient Re-evaluated:Patient Re-evaluated prior to induction Oxygen Delivery Method: Circle system utilized Preoxygenation: Pre-oxygenation with 100% oxygen Induction Type: IV induction Ventilation: Mask ventilation without difficulty Laryngoscope Size: 3 and McGraph Grade View: Grade I Tube type: Oral Tube size: 7.0 mm Number of attempts: 1 Airway Equipment and Method: Stylet Placement Confirmation: ETT inserted through vocal cords under direct vision,  positive ETCO2 and breath sounds checked- equal and bilateral Secured at: 21 cm Tube secured with: Tape Dental Injury: Teeth and Oropharynx as per pre-operative assessment

## 2020-02-21 NOTE — Progress Notes (Signed)
Progress Note - Vaginal Delivery  Connie Webster is a 39 y.o. [phone] now PP day 1 s/p Vaginal, Spontaneous . Scheduled for postpartum tubal this afternoon with Dr. Valentino Saxon.   Subjective:  The patient reports no complaints, up ad lib, voiding, tolerating PO and + flatus   Objective:  Vital signs in last 24 hours: Temp:  [97.5 F (36.4 C)-99 F (37.2 C)] 98.1 F (36.7 C) (10/31 0833) Pulse Rate:  [81-103] 85 (10/31 0833) Resp:  [18-20] 18 (10/31 0833) BP: (85-155)/(62-96) 128/86 (10/31 0833) SpO2:  [97 %-100 %] 97 % (10/31 6789)  Physical Exam:  General: alert, cooperative, appears older than stated age and no distress Lochia: appropriate Uterine Fundus: firm    Data Review Recent Labs    02/20/20 0503 02/21/20 0537  HGB 11.8* 10.8*  HCT 36.1 32.6*    Assessment/Plan: Active Problems:   Labor and delivery, indication for care   Poor fetal growth affecting management of mother in third trimester   Plan for discharge tomorrow   -- Continue routine PP care.     Doreene Burke, CNM  02/21/2020 10:48 AM

## 2020-02-21 NOTE — Lactation Note (Signed)
This note was copied from a baby's chart. Lactation Consultation Note  Patient Name: Connie Webster [id number] Date: 02/21/2020 Reason for consult: Follow-up assessment;Late-preterm 34-36.6wks;Infant < 6lbs;Other (Comment) (SGA cluster feeding - Giving form supplementation at the br )  Jearld Fenton was born at 36.3 weeks and weighed less that 5 lbs.  Initially she had low blood glucose.  Mom had to supplement with her first baby in the beginning and wants to do the same with [name] using 22 calorie formula recommended by the Neonatologist.  Blood glucose is stabilized now but mom still wants to supplement. Mom chose to give the formula supplementation via curved tip syringe at the breast to prevent nipple confusion and continue to stimulate the breast which she has mastered by herself now.  Mom already has a Lansinoh pump from her [employer] if she struggles with milk supply issues as she has in the past.  Mom tried an SNS at the breast, but mom and baby seem to do better with curved tip syringe at the breast.  Mom denies breast or nipple pain, but did choose to get the coconut oil.  Instructions given in use.  [name] has been cluster feeding.  Mom is willing to put her to the breast whenever she demonstrates feeding cues.  She lets her breast feed first with out supplementation and then puts her back to the breast and gives the 22 calorie Similac via curved tip syringe at the breast.  She is voiding and having bowel movements frequently and 24 hour transcutaneous bilirubin was within normal limits.  Hand out given on what to expect with feedings the first 4 days of life reviewing normal newborn stomach size, adequate intake and out put, supply and demand, normal course of lactation and routine newborn feeding patterns.  Lactation [employer] and LLL hand out given with contact numbers, websites and support groups and reviewed.  Lactation name and number written on white board and  encouraged to call with any questions, concerns or assistance. Maternal Data Formula Feeding for Exclusion: No Reason for exclusion:  (Choosing to supp with form at the br via curved tip syringe) Has patient been taught Hand Expression?: Yes Does the patient have breastfeeding experience prior to this delivery?: Yes  Feeding Feeding Type: Breast Fed (Giving formula via curved tip syringe at the breast)  LATCH Score Latch: Grasps breast easily, tongue down, lips flanged, rhythmical sucking.  Audible Swallowing: Spontaneous and intermittent (When using curved tip syringe at the breast)  Type of Nipple: Everted at rest and after stimulation  Comfort (Breast/Nipple): Soft / non-tender  Hold (Positioning): No assistance needed to correctly position infant at breast.  LATCH Score: 10  Interventions Interventions: Breast feeding basics reviewed;Skin to skin;Breast massage;Hand express;Breast compression;Support pillows;Coconut oil  Lactation Tools Discussed/Used Tools: Coconut oil;Supplemental Nutrition System WIC Program: No [employer])   Consult Status Consult Status: Follow-up Follow-up type: Call as needed    Louis Meckel 02/21/2020, 7:59 PM

## 2020-02-22 ENCOUNTER — Encounter: Payer: 59 | Admitting: Certified Nurse Midwife

## 2020-02-22 ENCOUNTER — Other Ambulatory Visit: Payer: 59

## 2020-02-22 ENCOUNTER — Encounter: Payer: Self-pay | Admitting: Obstetrics and Gynecology

## 2020-02-22 ENCOUNTER — Ambulatory Visit: Payer: Self-pay

## 2020-02-22 MED ORDER — IBUPROFEN 600 MG PO TABS
600.0000 mg | ORAL_TABLET | Freq: Four times a day (QID) | ORAL | 0 refills | Status: DC
Start: 2020-02-22 — End: 2022-05-24

## 2020-02-22 MED ORDER — IBUPROFEN 600 MG PO TABS
600.0000 mg | ORAL_TABLET | Freq: Four times a day (QID) | ORAL | Status: DC
  Administered 2020-02-22 (×2): 600 mg via ORAL
  Filled 2020-02-22 (×2): qty 1

## 2020-02-22 MED ORDER — OXYCODONE-ACETAMINOPHEN 5-325 MG PO TABS: 2.0000 | ORAL_TABLET | ORAL | 0 refills | Status: DC | PRN

## 2020-02-22 NOTE — Progress Notes (Signed)
Discharge instructions, prescriptions, education, and appointments given and explained. Pt verbalized understanding with no further questions. Mother staying in room to room in with infant, infant staying for feeds.

## 2020-02-22 NOTE — Lactation Note (Signed)
This note was copied from a baby's chart. Lactation Consultation Note  Patient Name: Girl Mayukha Symmonds [id number] Date: 02/22/2020 Reason for consult: Follow-up assessment;Late-preterm 34-36.6wks;Infant < 6lbs;Other (Comment) (Mom supplementing with 22 cal formula via bottle this fdg)  Lactation has been working with mom all day today with feeding via curved tip syringe at the breast and finger feeding, using SNS at the breast and for this last feed pacing with a bottle per mom's request.  Once mom is shown one time, she can breast feed using curved tip syringe, SNS or bottle on her own.  Mom is continuing to give 22 calorie formula as suggested by neonatologist.  Mom is also pumping using her Lansinoh pump that she got from her [employer].  Encouraged mom to call with any questions, concerns or assistance.  Maternal Data Formula Feeding for Exclusion: No Reason for exclusion:  (Neonatologist wants her to supplement with 22 cal formula) Has patient been taught Hand Expression?: Yes Does the patient have breastfeeding experience prior to this delivery?: Yes  Feeding Feeding Type: Bottle Fed - Formula  LATCH Score                   Interventions    Lactation Tools Discussed/Used WIC Program: No [employer] Insurance) Pump Review: Setup, frequency, and cleaning;Milk Storage;Other (comment) Initiated by:: mom Date initiated:: 02/22/20   Consult Status Consult Status: Follow-up Follow-up type: Call as needed    Louis Meckel 02/22/2020, 9:41 PM

## 2020-02-22 NOTE — Discharge Summary (Signed)
Patient Name: Connie Webster DOB: 06-Dec-1980 MRN: [id number]                            Discharge Summary  Date of Admission: 02/20/2020 Date of Discharge: 02/22/2020 Delivering Provider: Doreene Burke   Admitting Diagnosis: Labor and delivery, indication for care [O75.9] at [redacted]w[redacted]d Secondary diagnosis:  Active Problems:   Labor and delivery, indication for care   Poor fetal growth affecting management of mother in third trimester Chronic hypertension  Mode of Delivery: normal spontaneous vaginal delivery              Discharge diagnosis: Preterm Pregnancy Delivered, CHTN and IUGR      Intrapartum Procedures: epidural and tubal ligation   Post partum procedures: none  Complications: none                     Discharge Day SOAP Note:  Progress Note - Vaginal Delivery  Connie Webster is a 39 y.o. [id number] now PP day 2 s/p Vaginal, Spontaneous . Delivery was complicated by hypertension and intra-uterine growth retardation  Subjective  The patient has the following complaints: has no unusual complaints  Pain is controlled with current medications.   Patient is urinating without difficulty.  She is ambulating well.     Objective  Vital signs: BP (!) 144/87 (BP Location: Right Arm)   Pulse 82   Temp 98 F (36.7 C) (Oral)   Resp 18   Ht 5' 3.75" (1.619 m)   Wt 77.5 kg   LMP 06/02/2019 (Exact Date)   SpO2 99%   Breastfeeding Unknown   BMI 29.57 kg/m   Physical Exam: Gen: NAD Fundus Fundal Tone: Firm  Lochia Amount: Scant        Data Review Labs: Lab Results  Component Value Date   WBC 21.9 (H) 02/21/2020   HGB 10.8 (L) 02/21/2020   HCT 32.6 (L) 02/21/2020   MCV 89.3 02/21/2020   PLT 286 02/21/2020   CBC Latest Ref Rng & Units 02/21/2020 02/20/2020 02/11/2020  WBC 4.0 - 10.5 K/uL 21.9(H) 17.7(H) 14.2(H)  Hemoglobin 12.0 - 15.0 g/dL 10.8(L) 11.8(L) 11.0(L)  Hematocrit 36 - 46 % 32.6(L) 36.1 33.7(L)  Platelets 150 - 400 K/uL  286 350 288   O POS  Edinburgh Score: Edinburgh Postnatal Depression Scale Screening Tool 02/21/2020  I have been able to laugh and see the funny side of things. 0  I have looked forward with enjoyment to things. 0  I have blamed myself unnecessarily when things went wrong. 1  I have been anxious or worried for no good reason. 2  I have felt scared or panicky for no good reason. 1  Things have been getting on top of me. 1  I have been so unhappy that I have had difficulty sleeping. 1  I have felt sad or miserable. 0  I have been so unhappy that I have been crying. 0  The thought of harming myself has occurred to me. 0  Edinburgh Postnatal Depression Scale Total 6    Assessment/Plan  Active Problems:   Labor and delivery, indication for care   Poor fetal growth affecting management of mother in third trimester  Chronic hypertension  Plan for discharge today.  Discharge Instructions: Per After Visit Summary. Activity: Advance as tolerated. Pelvic rest for 6 weeks.  Also refer to After Visit Summary Diet: Regular Medications: Allergies as of 02/22/2020  Reactions   Peanut-containing Drug Products Anaphylaxis      Medication List    STOP taking these medications   aspirin EC 81 MG tablet     TAKE these medications   albuterol 108 (90 Base) MCG/ACT inhaler Commonly known as: VENTOLIN HFA Inhale 2 puffs into the lungs every 4 (four) hours as needed for wheezing or shortness of breath.   folic acid 1 MG tablet Commonly known as: FOLVITE Take 1 mg by mouth daily.   ibuprofen 600 MG tablet Commonly known as: ADVIL Take 1 tablet (600 mg total) by mouth every 6 (six) hours.   multivitamin tablet Take 1 tablet by mouth daily.   NIFEdipine 30 MG 24 hr tablet Commonly known as: Procardia XL Take 2 tablets (60 mg total) by mouth daily.   oxyCODONE-acetaminophen 5-325 MG tablet Commonly known as: PERCOCET/ROXICET Take 2 tablets by mouth every 4 (four) hours as  needed (pain scale > 7).      Outpatient follow up: 2 wk tele visit, 6 wk pp visit with Doreene Burke, CNM  Postpartum contraception: Postpartum tubal  Discharged Condition: good  Discharged to: home  Newborn Data: Disposition:home with mother  Apgars: APGAR (1 MIN): 7   APGAR (5 MINS): 8   APGAR (10 MINS):    Baby Feeding: Bottle and Breast    Doreene Burke, CNM  02/22/2020 7:34 AM

## 2020-02-22 NOTE — Final Progress Note (Signed)
Discharge Day SOAP Note:  Progress Note - Vaginal Delivery  Connie Webster is a 39 y.o. [phone] now PP day 2 s/p Vaginal, Spontaneous . Delivery was complicated by hypertension and intra-uterine growth retardation  Subjective  The patient has the following complaints: has no unusual complaints  Pain is controlled with current medications.   Patient is urinating without difficulty.  She is ambulating well.     Objective  Vital signs: BP (!) 144/87 (BP Location: Right Arm)   Pulse 82   Temp 98 F (36.7 C) (Oral)   Resp 18   Ht 5' 3.75" (1.619 m)   Wt 77.5 kg   LMP 06/02/2019 (Exact Date)   SpO2 99%   Breastfeeding Unknown   BMI 29.57 kg/m   Physical Exam: Gen: NAD Fundus Fundal Tone: Firm  Lochia Amount: Scant        Data Review Labs: Lab Results  Component Value Date   WBC 21.9 (H) 02/21/2020   HGB 10.8 (L) 02/21/2020   HCT 32.6 (L) 02/21/2020   MCV 89.3 02/21/2020   PLT 286 02/21/2020   CBC Latest Ref Rng & Units 02/21/2020 02/20/2020 02/11/2020  WBC 4.0 - 10.5 K/uL 21.9(H) 17.7(H) 14.2(H)  Hemoglobin 12.0 - 15.0 g/dL 10.8(L) 11.8(L) 11.0(L)  Hematocrit 36 - 46 % 32.6(L) 36.1 33.7(L)  Platelets 150 - 400 K/uL 286 350 288   O POS  Edinburgh Score: Edinburgh Postnatal Depression Scale Screening Tool 02/21/2020  I have been able to laugh and see the funny side of things. 0  I have looked forward with enjoyment to things. 0  I have blamed myself unnecessarily when things went wrong. 1  I have been anxious or worried for no good reason. 2  I have felt scared or panicky for no good reason. 1  Things have been getting on top of me. 1  I have been so unhappy that I have had difficulty sleeping. 1  I have felt sad or miserable. 0  I have been so unhappy that I have been crying. 0  The thought of harming myself has occurred to me. 0  Edinburgh Postnatal Depression Scale Total 6    Assessment/Plan  Active Problems:   Labor and delivery,  indication for care   Poor fetal growth affecting management of mother in third trimester  Chronic hypertension  Plan for discharge today.  Discharge Instructions: Per After Visit Summary. Activity: Advance as tolerated. Pelvic rest for 6 weeks.  Also refer to After Visit Summary Diet: Regular Medications: Allergies as of 02/22/2020      Reactions   Peanut-containing Drug Products Anaphylaxis      Medication List    STOP taking these medications   aspirin EC 81 MG tablet     TAKE these medications   albuterol 108 (90 Base) MCG/ACT inhaler Commonly known as: VENTOLIN HFA Inhale 2 puffs into the lungs every 4 (four) hours as needed for wheezing or shortness of breath.   folic acid 1 MG tablet Commonly known as: FOLVITE Take 1 mg by mouth daily.   ibuprofen 600 MG tablet Commonly known as: ADVIL Take 1 tablet (600 mg total) by mouth every 6 (six) hours.   multivitamin tablet Take 1 tablet by mouth daily.   NIFEdipine 30 MG 24 hr tablet Commonly known as: Procardia XL Take 2 tablets (60 mg total) by mouth daily.   oxyCODONE-acetaminophen 5-325 MG tablet Commonly known as: PERCOCET/ROXICET Take 2 tablets by mouth every 4 (four) hours as needed (pain scale >  7).      Outpatient follow up: 2 wk tele visit, 6 wk pp visit with Doreene Burke, CNM  Postpartum contraception: Postpartum tubal  Discharged Condition: good  Discharged to: home  Newborn Data: Disposition:home with mother  Apgars: APGAR (1 MIN): 7   APGAR (5 MINS): 8   APGAR (10 MINS):    Baby Feeding: Bottle and Breast    Doreene Burke, CNM  02/22/2020 7:34 AM

## 2020-02-23 ENCOUNTER — Encounter: Payer: 59 | Admitting: Certified Nurse Midwife

## 2020-02-23 ENCOUNTER — Ambulatory Visit: Payer: Self-pay

## 2020-02-23 ENCOUNTER — Other Ambulatory Visit: Payer: 59

## 2020-02-23 NOTE — Lactation Note (Signed)
This note was copied from a baby's chart. Lactation Consultation Note  Patient Name: Connie Webster JJHER'D Date: 02/23/2020 Reason for consult: Follow-up assessment  Lactation Rounds: LC to the room for a visit. Mother and baby are feeding at breast then paced bottle feeding 22cal formula. Mother states feeds are going well. Also stated she was pumping and wasn't getting anything. LC encouraged pumping 8x's/24hrs and at least one of those sessions is at night. LC reviewed and encouraged feeding on demand and with cues. Reviewed diaper counts for days of life and when to call Peds with questions. Reviewed outpatient Lactation number and resources. Mother is tracking feeds and diapers in her notes in cellphone. Parents stated understanding with all teaching.    Maternal Data Formula Feeding for Exclusion: No  Feeding Feeding Type: Bottle Fed - Formula Nipple Type: Extra Slow Flow   Interventions Interventions: Breast feeding basics reviewed;DEBP;Ice  Lactation Tools Discussed/Used     Consult Status Consult Status: PRN Follow-up type: Call as needed    Connie Webster 02/23/2020, 9:54 AM

## 2020-02-24 LAB — SURGICAL PATHOLOGY

## 2020-02-25 LAB — SURGICAL PATHOLOGY

## 2020-03-02 ENCOUNTER — Other Ambulatory Visit: Payer: Self-pay

## 2020-03-02 ENCOUNTER — Ambulatory Visit (INDEPENDENT_AMBULATORY_CARE_PROVIDER_SITE_OTHER): Payer: 59 | Admitting: Certified Nurse Midwife

## 2020-03-02 DIAGNOSIS — Z1331 Encounter for screening for depression: Secondary | ICD-10-CM | POA: Diagnosis not present

## 2020-03-02 NOTE — Progress Notes (Signed)
Virtual Visit via Telephone Note  I connected with Connie Webster on 03/02/20 at  4:00 PM EST by telephone and verified that I am speaking with the correct person using two identifiers.  Location: Patient: @ home Provider: A. Janee Morn, CNM @ office Nurse S. Sick @ office    I discussed the limitations, risks, security and privacy concerns of performing an evaluation and management service by telephone and the availability of in person appointments. I also discussed with the patient that there may be a patient responsible charge related to this service. The patient expressed understanding and agreed to proceed.   History of Present Illness:  status post SVD and bilateral tubal 10/30.    Observations/Objective: Pt state she is doing well. She is still bleeding, states that she may be doing a litten more than she should at home with 2 other children, but over all that it has decreased. She has some residual back pain from epidural but is managing. She is nursing and baby has gained 2 lbs. States her mood is good and she denies signs of PPD.     Office Visit from 03/02/2020 in [facility]  PHQ-9 Total Score 6     Plan: follow up as scheduled in 4 wks for PPV or call for any questions or concerns.      I discussed the assessment and treatment plan with the patient. The patient was provided an opportunity to ask questions and all were answered. The patient agreed with the plan and demonstrated an understanding of the instructions.   The patient was advised to call back or seek an in-person evaluation if the symptoms worsen or if the condition fails to improve as anticipated.  I provided 7 minutes of non-face-to-face time during this encounter.   Doreene Burke, CNM

## 2020-03-02 NOTE — Progress Notes (Signed)
Received transferred call from Ardelle Lesches for 2 week televisit. DOB as identifier. Is breast feeding. Menstrual flow still heavy. Call transferred to Doreene Burke CNM for completion of televisit.

## 2020-03-23 ENCOUNTER — Ambulatory Visit (INDEPENDENT_AMBULATORY_CARE_PROVIDER_SITE_OTHER): Payer: 59 | Admitting: Certified Nurse Midwife

## 2020-03-23 ENCOUNTER — Encounter: Payer: Self-pay | Admitting: Certified Nurse Midwife

## 2020-03-23 ENCOUNTER — Other Ambulatory Visit: Payer: Self-pay

## 2020-03-23 NOTE — Patient Instructions (Signed)
Preventive Care 21-39 Years Old, Female Preventive care refers to visits with your health care provider and lifestyle choices that can promote health and wellness. This includes:  A yearly physical exam. This may also be called an annual well check.  Regular dental visits and eye exams.  Immunizations.  Screening for certain conditions.  Healthy lifestyle choices, such as eating a healthy diet, getting regular exercise, not using drugs or products that contain nicotine and tobacco, and limiting alcohol use. What can I expect for my preventive care visit? Physical exam Your health care provider will check your:  Height and weight. This may be used to calculate body mass index (BMI), which tells if you are at a healthy weight.  Heart rate and blood pressure.  Skin for abnormal spots. Counseling Your health care provider may ask you questions about your:  Alcohol, tobacco, and drug use.  Emotional well-being.  Home and relationship well-being.  Sexual activity.  Eating habits.  Work and work environment.  Method of birth control.  Menstrual cycle.  Pregnancy history. What immunizations do I need?  Influenza (flu) vaccine  This is recommended every year. Tetanus, diphtheria, and pertussis (Tdap) vaccine  You may need a Td booster every 10 years. Varicella (chickenpox) vaccine  You may need this if you have not been vaccinated. Human papillomavirus (HPV) vaccine  If recommended by your health care provider, you may need three doses over 6 months. Measles, mumps, and rubella (MMR) vaccine  You may need at least one dose of MMR. You may also need a second dose. Meningococcal conjugate (MenACWY) vaccine  One dose is recommended if you are age 19-21 years and a first-year college student living in a residence hall, or if you have one of several medical conditions. You may also need additional booster doses. Pneumococcal conjugate (PCV13) vaccine  You may need  this if you have certain conditions and were not previously vaccinated. Pneumococcal polysaccharide (PPSV23) vaccine  You may need one or two doses if you smoke cigarettes or if you have certain conditions. Hepatitis A vaccine  You may need this if you have certain conditions or if you travel or work in places where you may be exposed to hepatitis A. Hepatitis B vaccine  You may need this if you have certain conditions or if you travel or work in places where you may be exposed to hepatitis B. Haemophilus influenzae type b (Hib) vaccine  You may need this if you have certain conditions. You may receive vaccines as individual doses or as more than one vaccine together in one shot (combination vaccines). Talk with your health care provider about the risks and benefits of combination vaccines. What tests do I need?  Blood tests  Lipid and cholesterol levels. These may be checked every 5 years starting at age 20.  Hepatitis C test.  Hepatitis B test. Screening  Diabetes screening. This is done by checking your blood sugar (glucose) after you have not eaten for a while (fasting).  Sexually transmitted disease (STD) testing.  BRCA-related cancer screening. This may be done if you have a family history of breast, ovarian, tubal, or peritoneal cancers.  Pelvic exam and Pap test. This may be done every 3 years starting at age 21. Starting at age 30, this may be done every 5 years if you have a Pap test in combination with an HPV test. Talk with your health care provider about your test results, treatment options, and if necessary, the need for more tests.   Follow these instructions at home: Eating and drinking   Eat a diet that includes fresh fruits and vegetables, whole grains, lean protein, and low-fat dairy.  Take vitamin and mineral supplements as recommended by your health care provider.  Do not drink alcohol if: ? Your health care provider tells you not to drink. ? You are  pregnant, may be pregnant, or are planning to become pregnant.  If you drink alcohol: ? Limit how much you have to 0-1 drink a day. ? Be aware of how much alcohol is in your drink. In the [place] one drink equals one 12 oz bottle of beer (355 mL), one 5 oz glass of wine (148 mL), or one 1 oz glass of hard liquor (44 mL). Lifestyle  Take daily care of your teeth and gums.  Stay active. Exercise for at least 30 minutes on 5 or more days each week.  Do not use any products that contain nicotine or tobacco, such as cigarettes, e-cigarettes, and chewing tobacco. If you need help quitting, ask your health care provider.  If you are sexually active, practice safe sex. Use a condom or other form of birth control (contraception) in order to prevent pregnancy and STIs (sexually transmitted infections). If you plan to become pregnant, see your health care provider for a preconception visit. What's next?  Visit your health care provider once a year for a well check visit.  Ask your health care provider how often you should have your eyes and teeth checked.  Stay up to date on all vaccines. This information is not intended to replace advice given to you by your health care provider. Make sure you discuss any questions you have with your health care provider. Document Revised: 12/19/2017 Document Reviewed: 12/19/2017 Elsevier Patient Education  2020 [employer].

## 2020-03-23 NOTE — Progress Notes (Signed)
Subjective:    Connie Webster is a 39 y.o. [phone] African American female who presents for a postpartum visit. She is 6 weeks postpartum following a spontaneous vaginal delivery and bilateral tubal ligation  at 36.3 gestational weeks. Anesthesia: epidural. I have fully reviewed the prenatal and intrapartum course. Postpartum course has been WNL. Baby's course has been WNL. Baby is feeding by breast and bottle. Bleeding no bleeding. Bowel function is normal. Bladder function is normal. Patient is not sexually active. Last sexual activity: prior to delivery. Contraception method is tubal ligation. Postpartum depression screening: negative. Score 3.  Last pap 09/11/2016 and was negative and HPV negative.  The following portions of the patient's history were reviewed and updated as appropriate: allergies, current medications, past medical history, past surgical history and problem list.  Review of Systems Pertinent items are noted in HPI.   There were no vitals filed for this visit. No LMP recorded.  Objective:   General:  alert, cooperative and no distress   Breasts:  deferred, no complaints  Lungs: clear to auscultation bilaterally  Heart:  regular rate and rhythm  Abdomen: soft, nontender   Vulva: normal  Vagina: normal vagina  Cervix:  closed  Corpus: Well-involuted  Adnexa:  Non-palpable  Rectal Exam: no hemorrhoids        Assessment:   Postpartum exam 6  wks s/p SVD & Bilateral tubal ligation Breast and bottle feeding Depression screening Contraception counseling   Plan:  : tubal ligation Follow up in:  for annual exam or earlier if needed  Doreene Burke, CNM

## 2020-03-27 ENCOUNTER — Other Ambulatory Visit: Payer: Self-pay | Admitting: Certified Nurse Midwife

## 2020-03-29 ENCOUNTER — Telehealth: Payer: Self-pay

## 2020-03-29 NOTE — Telephone Encounter (Signed)
Mychart message sent.

## 2020-04-03 ENCOUNTER — Other Ambulatory Visit: Payer: Self-pay | Admitting: Certified Nurse Midwife

## 2020-05-03 ENCOUNTER — Other Ambulatory Visit: Payer: Self-pay

## 2020-05-03 ENCOUNTER — Emergency Department
Admission: EM | Admit: 2020-05-03 | Discharge: 2020-05-03 | Disposition: A | Discharge status: Home / Self Care | Payer: 59 | Attending: Emergency Medicine | Admitting: Emergency Medicine

## 2020-05-03 DIAGNOSIS — I10 Essential (primary) hypertension: Secondary | ICD-10-CM | POA: Insufficient documentation

## 2020-05-03 DIAGNOSIS — J45909 Unspecified asthma, uncomplicated: Secondary | ICD-10-CM | POA: Insufficient documentation

## 2020-05-03 DIAGNOSIS — U071 COVID-19: Secondary | ICD-10-CM | POA: Insufficient documentation

## 2020-05-03 DIAGNOSIS — Z91010 Allergy to peanuts: Secondary | ICD-10-CM | POA: Diagnosis not present

## 2020-05-03 DIAGNOSIS — R531 Weakness: Secondary | ICD-10-CM

## 2020-05-03 DIAGNOSIS — R519 Headache, unspecified: Secondary | ICD-10-CM | POA: Diagnosis present

## 2020-05-03 LAB — CBC
HCT: 47.4 % — ABNORMAL HIGH (ref 36.0–46.0)
Hemoglobin: 15.8 g/dL — ABNORMAL HIGH (ref 12.0–15.0)
MCH: 28.8 pg (ref 26.0–34.0)
MCHC: 33.3 g/dL (ref 30.0–36.0)
MCV: 86.3 fL (ref 80.0–100.0)
Platelets: 331 10*3/uL (ref 150–400)
RBC: 5.49 MIL/uL — ABNORMAL HIGH (ref 3.87–5.11)
RDW: 14.3 % (ref 11.5–15.5)
WBC: 7 10*3/uL (ref 4.0–10.5)
nRBC: 0 % (ref 0.0–0.2)

## 2020-05-03 LAB — BASIC METABOLIC PANEL
Anion gap: 11 (ref 5–15)
BUN: 10 mg/dL (ref 6–20)
CO2: 28 mmol/L (ref 22–32)
Calcium: 9.3 mg/dL (ref 8.9–10.3)
Chloride: 99 mmol/L (ref 98–111)
Creatinine, Ser: 0.92 mg/dL (ref 0.44–1.00)
GFR, Estimated: >60 mL/min (ref >60)
Glucose, Bld: 160 mg/dL — ABNORMAL HIGH (ref 70–99)
Potassium: 4.3 mmol/L (ref 3.5–5.1)
Sodium: 138 mmol/L (ref 135–145)

## 2020-05-03 LAB — URINALYSIS, COMPLETE (UACMP) WITH MICROSCOPIC
Bilirubin Urine: NEGATIVE
Glucose, UA: NEGATIVE mg/dL
Ketones, ur: NEGATIVE mg/dL
Leukocytes,Ua: NEGATIVE
Nitrite: NEGATIVE
Protein, ur: NEGATIVE mg/dL
Specific Gravity, Urine: 1.018 (ref 1.005–1.030)
pH: 6 (ref 5.0–8.0)

## 2020-05-03 LAB — TROPONIN I (HIGH SENSITIVITY): Troponin I (High Sensitivity): 2 ng/L (ref <18)

## 2020-05-03 LAB — POC SARS CORONAVIRUS 2 AG -  ED: SARS Coronavirus 2 Ag: POSITIVE — AB

## 2020-05-03 MED ORDER — KETOROLAC TROMETHAMINE 30 MG/ML IJ SOLN
30.0000 mg | Freq: Once | INTRAMUSCULAR | Status: AC
  Administered 2020-05-03: 30 mg via INTRAVENOUS
  Filled 2020-05-03: qty 1

## 2020-05-03 MED ORDER — ONDANSETRON HCL 4 MG/2ML IJ SOLN
4.0000 mg | Freq: Once | INTRAMUSCULAR | Status: AC
  Administered 2020-05-03: 4 mg via INTRAVENOUS
  Filled 2020-05-03: qty 2

## 2020-05-03 MED ORDER — SODIUM CHLORIDE 0.9 % IV BOLUS
1000.0000 mL | Freq: Once | INTRAVENOUS | Status: AC
  Administered 2020-05-03: 1000 mL via INTRAVENOUS

## 2020-05-03 NOTE — ED Notes (Signed)
Pt reports symptoms of "floaters" in vision, headache x 1 day. Pt reports she has not taken her HTN medication x 2 months. Hx of preeclampsia and is 2 months post partum. Pt reports increased salt intake in diet recently. Denies chest pain. Pt also notes dry cough. Covid tested yesterday with pending result.

## 2020-05-03 NOTE — ED Notes (Signed)
ED Provider at bedside. 

## 2020-05-03 NOTE — ED Provider Notes (Signed)
[facility] Emergency Department Provider Note  Time seen: 8:49 PM  I have reviewed the triage vital signs and the nursing notes.   HISTORY  Chief Complaint Hypertension and Headache  HPI Connie Webster is a 40 y.o. female   with a past medical history of anxiety, asthma, hypertension, presents to the emergency department for medical evaluation.  According to the patient since yesterday she has not been feeling well which she describes as generalized fatigue headache.  Patient states she took her blood pressure at home yesterday and it was 150/120.  Patient became concerned, restarted her blood pressure medication which she states she had not been taking for some time.  Patient states today she was feeling weak and "woozy" with blurred vision so the patient came to the emergency department for evaluation.  Patient admits that she was feeling extremely anxious and "panicked."  Currently the patient appears well, no distress, cooperative with reassuring vitals current blood pressure 134/77.  Past Medical History:  Diagnosis Date  . Anxiety   . Asthma    frequently uses inhaler  . Depression    takes meds  . Elevated blood pressure affecting pregnancy, antepartum 03/06/2017  . Gestational diabetes    diet controlled  . Gestational diabetes   . Hypertension    gestational   . Umbilical hernia     Patient Active Problem List   Diagnosis Date Noted  . Vitamin D deficiency 09/27/2019  . History of UTI 09/25/2019  . Umbilical hernia 02/05/2017    Past Surgical History:  Procedure Laterality Date  . DILATION AND CURETTAGE OF UTERUS    . TUBAL LIGATION Bilateral 02/21/2020   Procedure: POST PARTUM TUBAL LIGATION;  Surgeon: Hildred Laser, MD;  Location: [facility] ORS;  Service: Gynecology;  Laterality: Bilateral;    Prior to Admission medications   Medication Sig Start Date End Date Taking? Authorizing Provider  albuterol (PROVENTIL HFA;VENTOLIN HFA)  108 (90 BASE) MCG/ACT inhaler Inhale 2 puffs into the lungs every 4 (four) hours as needed for wheezing or shortness of breath.    [provider]  folic acid (FOLVITE) 1 MG tablet Take 1 mg by mouth daily.     [provider]  ibuprofen (ADVIL) 600 MG tablet Take 1 tablet (600 mg total) by mouth every 6 (six) hours. 02/22/20   Doreene Burke, CNM  Multiple Vitamin (MULTIVITAMIN) tablet Take 1 tablet by mouth daily.    [provider]  NIFEdipine (PROCARDIA-XL/NIFEDICAL-XL) 30 MG 24 hr tablet TAKE 1 TABLET BY MOUTH EVERY DAY 04/04/20   Doreene Burke, CNM  oxyCODONE-acetaminophen (PERCOCET/ROXICET) 5-325 MG tablet Take 2 tablets by mouth every 4 (four) hours as needed (pain scale > 7). 02/22/20   Doreene Burke, CNM    Allergies  Allergen Reactions  . Peanut-Containing Drug Products Anaphylaxis    Family History  Problem Relation Age of Onset  . Epilepsy Father   . Epilepsy Brother     Social History Social History   Tobacco Use  . Smoking status: Never Smoker  . Smokeless tobacco: Never Used  Vaping Use  . Vaping Use: Never used  Substance Use Topics  . Alcohol use: No  . Drug use: No    Review of Systems Constitutional: Negative for fever.  Generalized fatigue. Eyes: Blurred vision/spots in vision today.  Largely resolved Cardiovascular: Negative for chest pain. Respiratory: Negative for shortness of breath.  Negative for cough. Gastrointestinal: Negative for abdominal pain, vomiting and diarrhea. Genitourinary: C-section delivery 2 months ago.  Musculoskeletal: Negative for musculoskeletal complaints Skin: Negative for skin complaints  Neurological: Positive for headache All other ROS negative  ____________________________________________   PHYSICAL EXAM:  VITAL SIGNS: ED Triage Vitals  Enc Vitals Group     BP 05/03/20 1936 134/77     Pulse Rate 05/03/20 1934 (!) 105     Resp 05/03/20 1934 20     Temp 05/03/20 1934 98.7 F (37.1 C)      Temp Source 05/03/20 1934 Oral     SpO2 05/03/20 1934 96 %     Weight 05/03/20 1937 164 lb (74.4 kg)     Height 05/03/20 1937 5\' 3"  (1.6 m)     Head Circumference --      Peak Flow --      Pain Score 05/03/20 1937 8     Pain Loc --      Pain Edu? --      Excl. in GC? --     Constitutional: Alert and oriented. Well appearing and in no distress. Eyes: Normal exam ENT      Head: Normocephalic and atraumatic.      Mouth/Throat: Mucous membranes are moist. Cardiovascular: Normal rate, regular rhythm.  Respiratory: Normal respiratory effort without tachypnea nor retractions. Breath sounds are clear  Gastrointestinal: Soft and nontender. No distention. Musculoskeletal: Nontender with normal range of motion in all extremities Neurologic:  Normal speech and language. No gross focal neurologic deficits Skin:  Skin is warm, dry and intact.  Psychiatric: Mood and affect are normal.   ____________________________________________    EKG  EKG viewed and interpreted by myself shows a normal sinus rhythm at 100 bpm with a narrow QRS, normal axis, normal intervals, no concerning ST changes.  ____________________________________________   INITIAL IMPRESSION / ASSESSMENT AND PLAN / ED COURSE  Pertinent labs & imaging results that were available during my care of the patient were reviewed by me and considered in my medical decision making (see chart for details).   Patient presents to the emergency department for generalized fatigue weakness headache and high blood pressure.  Patient's blood pressure is much improved currently 134/77.  Overall patient appears well.  Patient states her child is coughing at home and she has generalized fatigue we will check a rapid COVID swab as a precaution.  Patient's lab work is reassuring.  However given her symptoms we will treat with IV fluids and Toradol.  Given the patient's reassuring work-up and reassuring physical exam I anticipate likely discharge  home.  Patient is very reassured by exam so far.  Urinalysis and COVID swab pending.  Patient care signed out to oncoming provider.  Anticipate likely discharge home.  Connie Webster was evaluated in Emergency Department on 05/03/2020 for the symptoms described in the history of present illness. She was evaluated in the context of the global COVID-19 pandemic, which necessitated consideration that the patient might be at risk for infection with the SARS-CoV-2 virus that causes COVID-19. Institutional protocols and algorithms that pertain to the evaluation of patients at risk for COVID-19 are in a state of rapid change based on information released by regulatory bodies including the CDC and federal and state organizations. These policies and algorithms were followed during the patient's care in the ED.  ____________________________________________   FINAL CLINICAL IMPRESSION(S) / ED DIAGNOSES  Headache Hypertension   07/01/2020, MD 05/03/20 2113

## 2020-05-03 NOTE — ED Triage Notes (Addendum)
Pt reports elevated blood pressure since yesterday.  Pt reports a headache and seeing spots today.  Pt also has n/v.  Pt reports dizziness.  No chest pain or sob.    Pt alert  Speech clear.  Hx PIH  Pt delivered 2 months ago at [facility]

## 2020-05-04 ENCOUNTER — Telehealth: Payer: Self-pay

## 2020-05-04 NOTE — Telephone Encounter (Signed)
Contacted pt. In regard to COVID 19 infusion therapy. Pt. Declines any discussion or treatment.

## 2020-05-04 NOTE — ED Provider Notes (Signed)
-----------------------------------------   12:05 AM on 05/04/2020 -----------------------------------------  Blood pressure 133/80, pulse 89, temperature 98.7 F (37.1 C), temperature source Oral, resp. rate 16, height 5\' 3"  (1.6 m), weight 74.4 kg, SpO2 98 %, currently breastfeeding.  Assuming care from Dr. .  In short, Connie Webster is a 40 y.o. female with a chief complaint of Hypertension and Headache .  Refer to the original H&P for additional details.  The current plan of care is to await urinalysis and remainder of labs.  Patient presented to the emergency department complaining of headache, generalized malaise and fatigue.  Current plan was to await lab results.  Overall in the exam has been reassuring.  Patient had improved with her blood pressure while here.  Patient's COVID test is positive, urinalysis is reassuring without evidence of ketones or protein.  At this time feel that symptoms are likely secondary to COVID.  No acute findings on physical exam or labs warranting further evaluation.  Patient is feeling again improved both symptomatically as well as in regards to her hypertension from time of arrival.  Patient stable for discharge at this time.  Patient will treat with Tylenol, Motrin, fluids at home for her COVID and symptoms.  Return precautions are discussed with the patient.  Follow-up with primary care/OB/GYN as needed.   ED diagnosis: Hypertension Headache COVID-19       24 05/04/20 0008    07/02/20, MD 05/04/20 07/02/20

## 2020-10-03 ENCOUNTER — Ambulatory Visit (INDEPENDENT_AMBULATORY_CARE_PROVIDER_SITE_OTHER): Payer: 59 | Admitting: Certified Nurse Midwife

## 2020-10-03 ENCOUNTER — Other Ambulatory Visit: Payer: Self-pay

## 2020-10-03 ENCOUNTER — Encounter: Payer: Self-pay | Admitting: Certified Nurse Midwife

## 2020-10-03 VITALS — BP 149/104 | HR 73 | Ht 64.0 in | Wt 170.6 lb

## 2020-10-03 DIAGNOSIS — Z01419 Encounter for gynecological examination (general) (routine) without abnormal findings: Secondary | ICD-10-CM | POA: Diagnosis not present

## 2020-10-03 DIAGNOSIS — Z1159 Encounter for screening for other viral diseases: Secondary | ICD-10-CM | POA: Diagnosis not present

## 2020-10-03 NOTE — Progress Notes (Signed)
GYNECOLOGY ANNUAL PREVENTATIVE CARE ENCOUNTER NOTE  History:     Connie Webster is a 40 y.o. [phone] female here for a routine annual gynecologic exam.  Current complaints: denies .   Denies abnormal vaginal bleeding, discharge, pelvic pain, problems with intercourse or other gynecologic concerns.     Social Relationship:married  Living: spouse and children Work: Paramedic,  Exercise: daily for hr or more Smoke/Alcohol/drug use: denies  Gynecologic History Patient's last menstrual period was 09/06/2020 (exact date). Contraception: tubal ligation Last Pap: 09/11/16. Results were: normal with negative HPV Last mammogram: n/a.  Obstetric History OB History  Gravida Para Term Preterm AB Living  4 3 2 1 1 3   SAB IAB Ectopic Multiple Live Births  1 0 0 0 3    # Outcome Date GA Lbr Len/2nd Weight Sex Delivery Anes PTL Lv  4 Preterm 02/20/20 [redacted]w[redacted]d 05:48 4 lb 9 oz (2.07 kg) F Vag-Spont EPI  LIV  3 Term 04/18/17 [redacted]w[redacted]d / 02:05 6 lb 6.5 oz (2.905 kg) F Vag-Spont EPI  LIV  2 Term 06/12/07 [redacted]w[redacted]d  7 lb 2 oz (3.232 kg) F Vag-Spont   LIV  1 SAB 2007 [redacted]w[redacted]d           Past Medical History:  Diagnosis Date   Anxiety    Asthma    frequently uses inhaler   Depression    takes meds   Elevated blood pressure affecting pregnancy, antepartum 03/06/2017   Gestational diabetes    diet controlled   Gestational diabetes    Hypertension    gestational    Umbilical hernia     Past Surgical History:  Procedure Laterality Date   DILATION AND CURETTAGE OF UTERUS     TUBAL LIGATION Bilateral 02/21/2020   Procedure: POST PARTUM TUBAL LIGATION;  Surgeon: 02/23/2020, MD;  Location: [facility] ORS;  Service: Gynecology;  Laterality: Bilateral;    Current Outpatient Medications on File Prior to Visit  Medication Sig Dispense Refill   albuterol (PROVENTIL HFA;VENTOLIN HFA) 108 (90 BASE) MCG/ACT inhaler Inhale 2 puffs into the lungs every 4 (four) hours as needed for wheezing or  shortness of breath.     ibuprofen (ADVIL) 600 MG tablet Take 1 tablet (600 mg total) by mouth every 6 (six) hours. 30 tablet 0   Multiple Vitamin (MULTIVITAMIN) tablet Take 1 tablet by mouth daily.     folic acid (FOLVITE) 1 MG tablet Take 1 mg by mouth daily.  (Patient not taking: Reported on 10/03/2020)     NIFEdipine (PROCARDIA-XL/NIFEDICAL-XL) 30 MG 24 hr tablet TAKE 1 TABLET BY MOUTH EVERY DAY (Patient not taking: Reported on 10/03/2020) 90 tablet 1   oxyCODONE-acetaminophen (PERCOCET/ROXICET) 5-325 MG tablet Take 2 tablets by mouth every 4 (four) hours as needed (pain scale > 7). (Patient not taking: Reported on 10/03/2020) 10 tablet 0   No current facility-administered medications on file prior to visit.    Allergies  Allergen Reactions   Peanut-Containing Drug Products Anaphylaxis    Social History:  reports that she has never smoked. She has never used smokeless tobacco. She reports that she does not drink alcohol and does not use drugs.  Family History  Problem Relation Age of Onset   Epilepsy Father    Heart attack Father    Epilepsy Brother     The following portions of the patient's history were reviewed and updated as appropriate: allergies, current medications, past family history, past medical history, past social history, past surgical history and problem  list.  Review of Systems Pertinent items noted in HPI and remainder of comprehensive ROS otherwise negative.  Physical Exam:  BP (!) 149/104   Pulse 73   Ht 5\' 4"  (1.626 m)   Wt 170 lb 9.6 oz (77.4 kg)   LMP 09/06/2020 (Exact Date)   BMI 29.28 kg/m  CONSTITUTIONAL: Well-developed, well-nourished female in no acute distress.  HENT:  Normocephalic, atraumatic, External right and left ear normal. Oropharynx is clear and moist EYES: Conjunctivae and EOM are normal. Pupils are equal, round, and reactive to light. No scleral icterus.  NECK: Normal range of motion, supple, no masses.  Normal thyroid.  SKIN: Skin is  warm and dry. No rash noted. Not diaphoretic. No erythema. No pallor. MUSCULOSKELETAL: Normal range of motion. No tenderness.  No cyanosis, clubbing, or edema.  2+ distal pulses. NEUROLOGIC: Alert and oriented to person, place, and time. Normal reflexes, muscle tone coordination.  PSYCHIATRIC: Normal mood and affect. Normal behavior. Normal judgment and thought content. CARDIOVASCULAR: Normal heart rate noted, regular rhythm RESPIRATORY: Clear to auscultation bilaterally. Effort and breath sounds normal, no problems with respiration noted. BREASTS: Symmetric in size. No masses, tenderness, skin changes, nipple drainage, or lymphadenopathy bilaterally.  ABDOMEN: Soft, no distention noted.  No tenderness, rebound or guarding.  PELVIC: Normal appearing external genitalia and urethral meatus; normal appearing vaginal mucosa and cervix.  No abnormal discharge noted.  Pap smear obtained.  Normal uterine size, no other palpable masses, no uterine or adnexal tenderness.  .   Assessment and Plan:   Annual Well Women GYN Exam  Pap: not due Mammogram : n/a  Labs: Hep c Refills: n/a  Referral: none  BP elevated, pt state her BP elevates with office visits, she has been monitoring it at home and it has been normal.  Routine preventative health maintenance measures emphasized. Please refer to After Visit Summary for other counseling recommendations.      09/08/2020, CNM [facility],  [facility]

## 2020-10-03 NOTE — Patient Instructions (Signed)
Preventive Care 21-39 Years Old, Female Preventive care refers to lifestyle choices and visits with your health care provider that can promote health and wellness. This includes: A yearly physical exam. This is also called an annual wellness visit. Regular dental and eye exams. Immunizations. Screening for certain conditions. Healthy lifestyle choices, such as: Eating a healthy diet. Getting regular exercise. Not using drugs or products that contain nicotine and tobacco. Limiting alcohol use. What can I expect for my preventive care visit? Physical exam Your health care provider may check your: Height and weight. These may be used to calculate your BMI (body mass index). BMI is a measurement that tells if you are at a healthy weight. Heart rate and blood pressure. Body temperature. Skin for abnormal spots. Counseling Your health care provider may ask you questions about your: Past medical problems. Family's medical history. Alcohol, tobacco, and drug use. Emotional well-being. Home life and relationship well-being. Sexual activity. Diet, exercise, and sleep habits. Work and work environment. Access to firearms. Method of birth control. Menstrual cycle. Pregnancy history. What immunizations do I need?  Vaccines are usually given at various ages, according to a schedule. Your health care provider will recommend vaccines for you based on your age, medicalhistory, and lifestyle or other factors, such as travel or where you work. What tests do I need?  Blood tests Lipid and cholesterol levels. These may be checked every 5 years starting at age 20. Hepatitis C test. Hepatitis B test. Screening Diabetes screening. This is done by checking your blood sugar (glucose) after you have not eaten for a while (fasting). STD (sexually transmitted disease) testing, if you are at risk. BRCA-related cancer screening. This may be done if you have a family history of breast, ovarian, tubal, or  peritoneal cancers. Pelvic exam and Pap test. This may be done every 3 years starting at age 21. Starting at age 30, this may be done every 5 years if you have a Pap test in combination with an HPV test. Talk with your health care provider about your test results, treatment options,and if necessary, the need for more tests. Follow these instructions at home: Eating and drinking  Eat a healthy diet that includes fresh fruits and vegetables, whole grains, lean protein, and low-fat dairy products. Take vitamin and mineral supplements as recommended by your health care provider. Do not drink alcohol if: Your health care provider tells you not to drink. You are pregnant, may be pregnant, or are planning to become pregnant. If you drink alcohol: Limit how much you have to 0-1 drink a day. Be aware of how much alcohol is in your drink. In the [place] one drink equals one 12 oz bottle of beer (355 mL), one 5 oz glass of wine (148 mL), or one 1 oz glass of hard liquor (44 mL).  Lifestyle Take daily care of your teeth and gums. Brush your teeth every morning and night with fluoride toothpaste. Floss one time each day. Stay active. Exercise for at least 30 minutes 5 or more days each week. Do not use any products that contain nicotine or tobacco, such as cigarettes, e-cigarettes, and chewing tobacco. If you need help quitting, ask your health care provider. Do not use drugs. If you are sexually active, practice safe sex. Use a condom or other form of protection to prevent STIs (sexually transmitted infections). If you do not wish to become pregnant, use a form of birth control. If you plan to become pregnant, see your health care   provider for a prepregnancy visit. Find healthy ways to cope with stress, such as: Meditation, yoga, or listening to music. Journaling. Talking to a trusted person. Spending time with friends and family. Safety Always wear your seat belt while driving or riding in a  vehicle. Do not drive: If you have been drinking alcohol. Do not ride with someone who has been drinking. When you are tired or distracted. While texting. Wear a helmet and other protective equipment during sports activities. If you have firearms in your house, make sure you follow all gun safety procedures. Seek help if you have been physically or sexually abused. What's next? Go to your health care provider once a year for an annual wellness visit. Ask your health care provider how often you should have your eyes and teeth checked. Stay up to date on all vaccines. This information is not intended to replace advice given to you by your health care provider. Make sure you discuss any questions you have with your healthcare provider. Document Revised: 12/06/2019 Document Reviewed: 12/19/2017 Elsevier Patient Education  2022 [employer].

## 2020-10-04 LAB — HEPATITIS C ANTIBODY: Hep C Virus Ab: 0.1 s/co ratio (ref 0.0–0.9)

## 2021-09-14 ENCOUNTER — Encounter: Payer: Self-pay | Admitting: Certified Nurse Midwife

## 2021-10-02 DIAGNOSIS — R350 Frequency of micturition: Secondary | ICD-10-CM | POA: Diagnosis not present

## 2021-10-02 DIAGNOSIS — N390 Urinary tract infection, site not specified: Secondary | ICD-10-CM | POA: Diagnosis not present

## 2021-10-04 ENCOUNTER — Encounter: Payer: 59 | Admitting: Certified Nurse Midwife

## 2021-11-30 ENCOUNTER — Encounter: Payer: Self-pay | Admitting: Certified Nurse Midwife

## 2022-01-30 IMAGING — US US MFM FETAL BPP W/O NON-STRESS
1 series · 11 of 11 positions shown · non-contrast
Comparison: none

[Series 1: us fetal bpp wo non stress · 11 acquisitions, 11 frames shown]
[im 1/11]
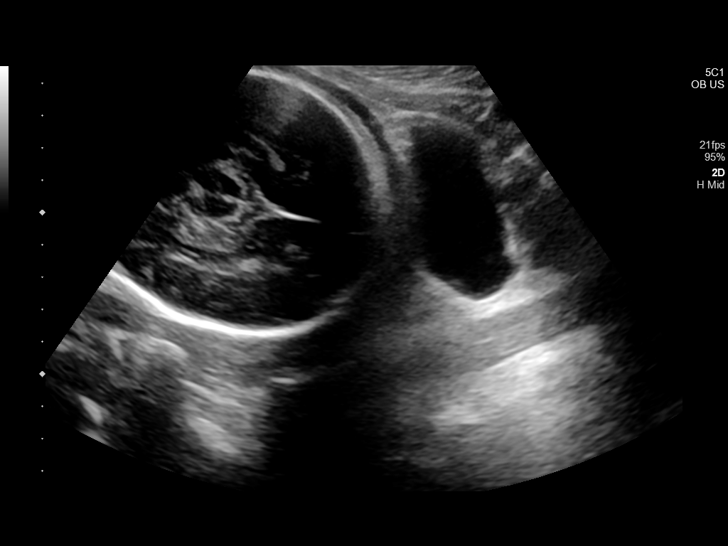
[im 2/11]
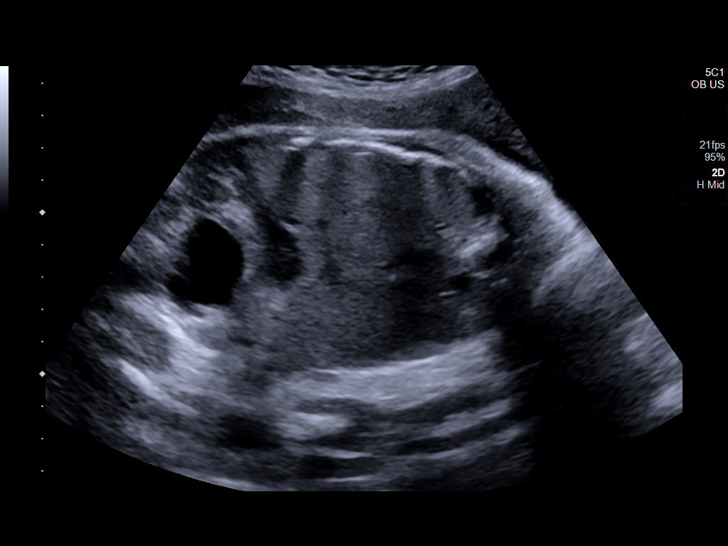
[im 3/11]
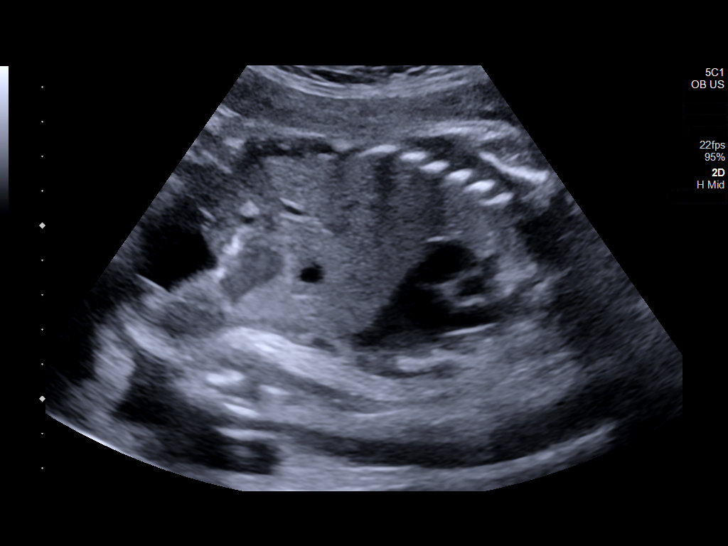
[im 4/11]
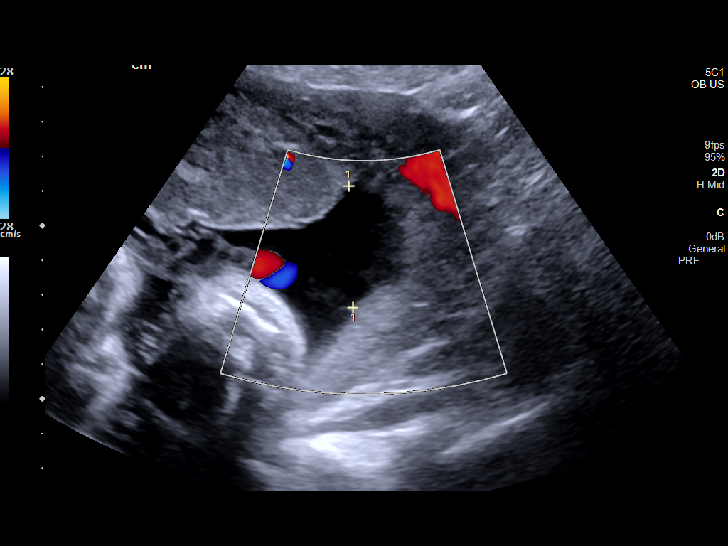
[im 5/11]
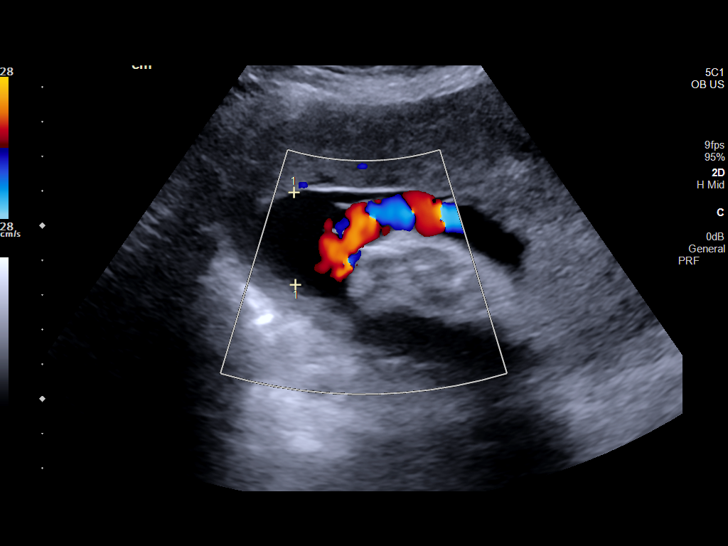
[im 6/11]
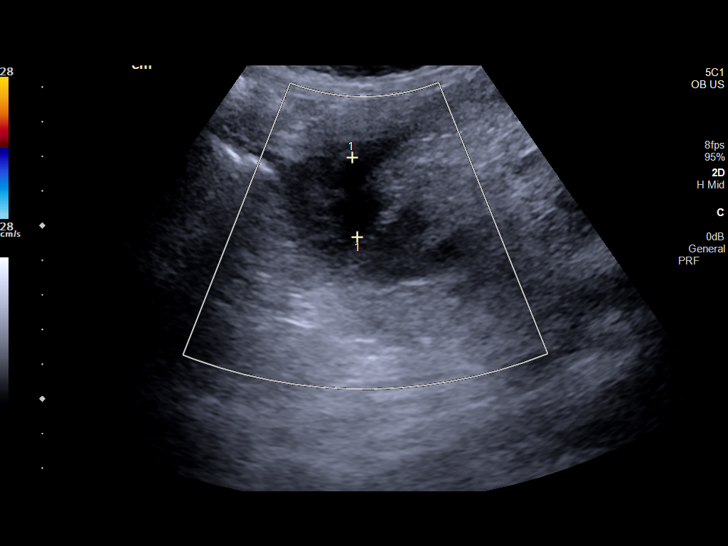
[im 7/11]
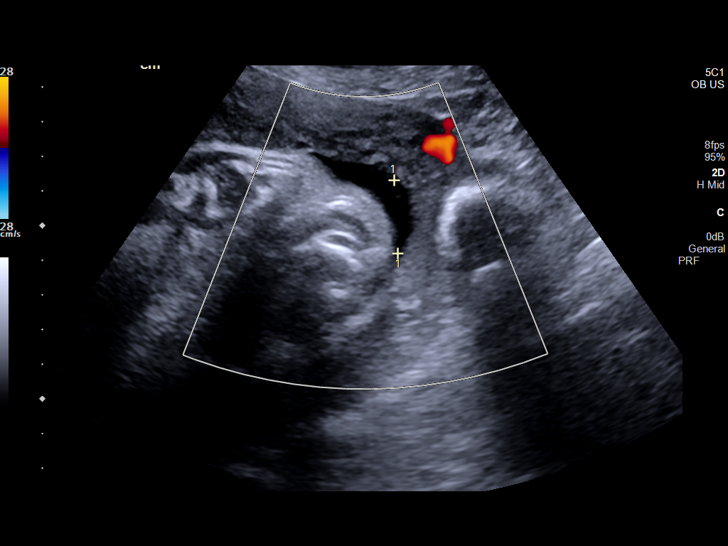
[im 8/11]
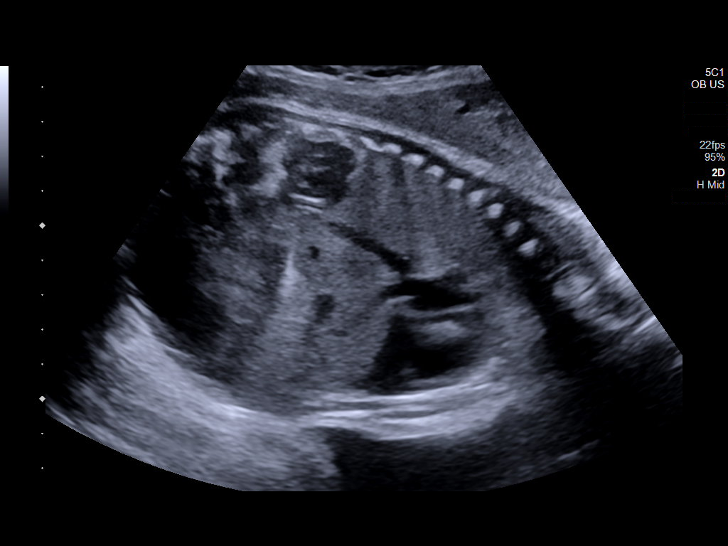
[im 9/11]
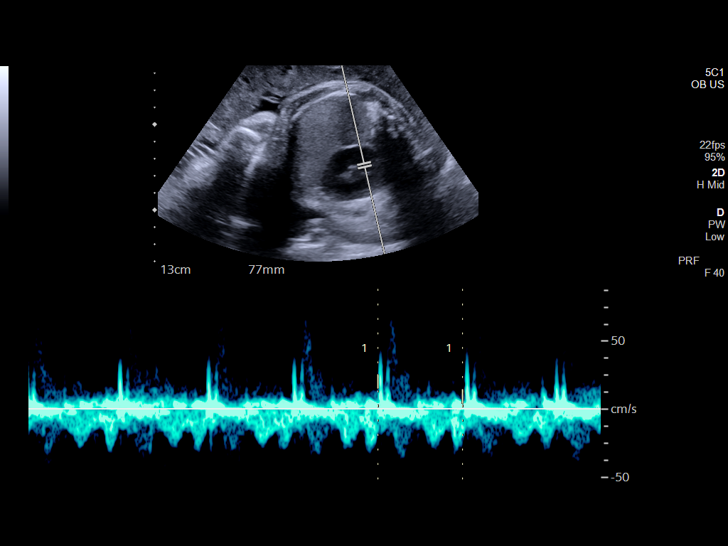
[im 10/11]
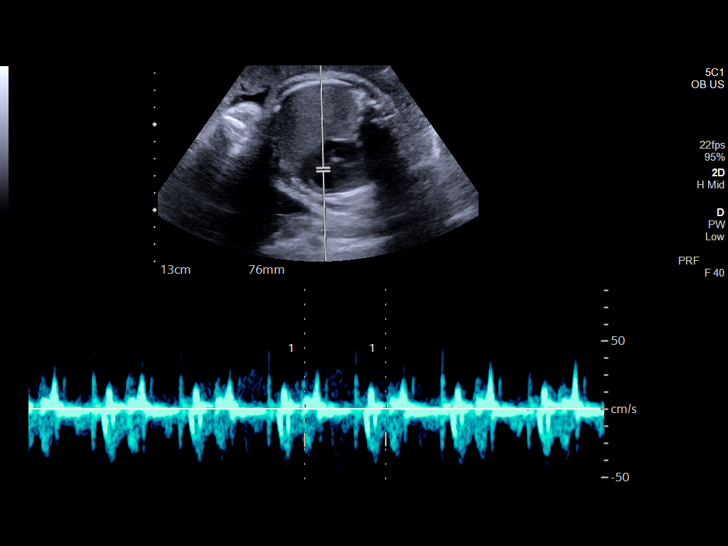
[im 11/11]
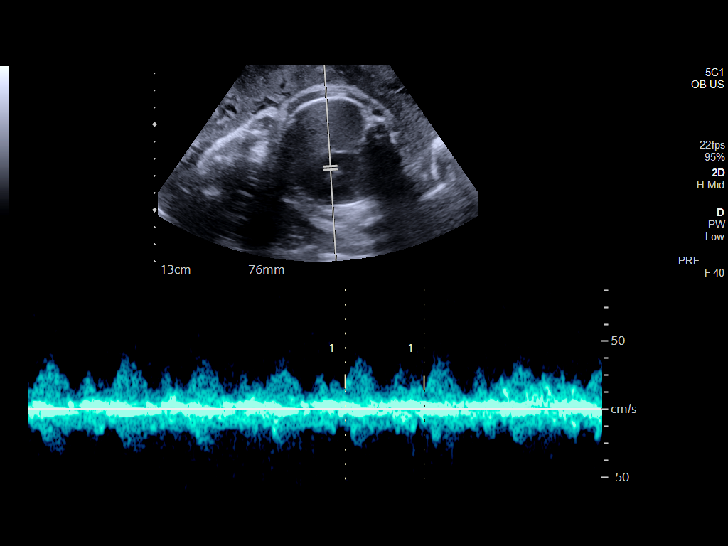

[11 of 11 positions shown; findings below may reference images not displayed]

MERCEDES

                                                            at [REDACTED]

Indications

 36 weeks gestation of pregnancy
 Hypertension - Gestational
 Maternal care for known or suspected poor
 fetal growth, third trimester, not applicable or
 unspecified IUGR
Fetal Evaluation

 Num Of Fetuses:         1
 Fetal Heart Rate(bpm):  130
 Cardiac Activity:       Observed
 Presentation:           Cephalic

 AFI Sum(cm)     %Tile       Largest Pocket(cm)
 10.6            27

 RUQ(cm)       RLQ(cm)       LUQ(cm)        LLQ(cm)

Biophysical Evaluation

 Amniotic F.V:   Within normal limits       F. Tone:        Observed
 F. Movement:    Observed                   Score:          [DATE]
 F. Breathing:   Observed
Gestational Age

 LMP:           37w 3d        Date:  06/02/19                 EDD:   03/08/20
 Best:          36w 2d     Det. By:  Early Ultrasound         EDD:   03/16/20
                                     (07/23/19)
Impression

 Antenatal testing performed due to prior BPP [DATE]
 Maternal hypertension noted.

 Repeat biophysical profile [DATE] with good fetal movement and
 amniotic fluid volume.

 Ms. Jivomira is scheduled for delivery in the
 following week.

 Fetal movement counseling provided by providers.
Recommendations

 Mako Maydhane La Chroma.
 Delivery in 1 week.
 If maternal decreased fetal movement experienced consider
 delivery.

## 2022-02-02 ENCOUNTER — Encounter: Payer: Self-pay | Admitting: Certified Nurse Midwife

## 2022-02-05 ENCOUNTER — Encounter: Payer: Self-pay | Admitting: Certified Nurse Midwife

## 2022-02-28 DIAGNOSIS — H6992 Unspecified Eustachian tube disorder, left ear: Secondary | ICD-10-CM | POA: Diagnosis not present

## 2022-05-24 ENCOUNTER — Emergency Department: Payer: BC Managed Care – PPO

## 2022-05-24 ENCOUNTER — Other Ambulatory Visit: Payer: Self-pay

## 2022-05-24 ENCOUNTER — Emergency Department
Admission: EM | Admit: 2022-05-24 | Discharge: 2022-05-24 | Disposition: A | Discharge status: Home / Self Care | Payer: BC Managed Care – PPO | Attending: Emergency Medicine | Admitting: Emergency Medicine

## 2022-05-24 DIAGNOSIS — R002 Palpitations: Secondary | ICD-10-CM | POA: Diagnosis not present

## 2022-05-24 DIAGNOSIS — E876 Hypokalemia: Secondary | ICD-10-CM | POA: Insufficient documentation

## 2022-05-24 DIAGNOSIS — R0602 Shortness of breath: Secondary | ICD-10-CM | POA: Insufficient documentation

## 2022-05-24 DIAGNOSIS — Z20822 Contact with and (suspected) exposure to covid-19: Secondary | ICD-10-CM | POA: Insufficient documentation

## 2022-05-24 DIAGNOSIS — M549 Dorsalgia, unspecified: Secondary | ICD-10-CM | POA: Diagnosis not present

## 2022-05-24 DIAGNOSIS — R0789 Other chest pain: Secondary | ICD-10-CM | POA: Diagnosis not present

## 2022-05-24 DIAGNOSIS — M7918 Myalgia, other site: Secondary | ICD-10-CM

## 2022-05-24 LAB — CBC
HCT: 40.4 % (ref 36.0–46.0)
Hemoglobin: 13.5 g/dL (ref 12.0–15.0)
MCH: 30.1 pg (ref 26.0–34.0)
MCHC: 33.4 g/dL (ref 30.0–36.0)
MCV: 90 fL (ref 80.0–100.0)
Platelets: 270 10*3/uL (ref 150–400)
RBC: 4.49 MIL/uL (ref 3.87–5.11)
RDW: 12.7 % (ref 11.5–15.5)
WBC: 7.5 10*3/uL (ref 4.0–10.5)
nRBC: 0 % (ref 0.0–0.2)

## 2022-05-24 LAB — BASIC METABOLIC PANEL
Anion gap: 8 (ref 5–15)
BUN: 11 mg/dL (ref 6–20)
CO2: 25 mmol/L (ref 22–32)
Calcium: 9 mg/dL (ref 8.9–10.3)
Chloride: 104 mmol/L (ref 98–111)
Creatinine, Ser: 0.88 mg/dL (ref 0.44–1.00)
GFR, Estimated: >60 mL/min (ref >60)
Glucose, Bld: 143 mg/dL — ABNORMAL HIGH (ref 70–99)
Potassium: 3.4 mmol/L — ABNORMAL LOW (ref 3.5–5.1)
Sodium: 137 mmol/L (ref 135–145)

## 2022-05-24 LAB — RESP PANEL BY RT-PCR (RSV, FLU A&B, COVID)  RVPGX2
Influenza A by PCR: NEGATIVE
Influenza B by PCR: NEGATIVE
Resp Syncytial Virus by PCR: NEGATIVE
SARS Coronavirus 2 by RT PCR: NEGATIVE

## 2022-05-24 LAB — TROPONIN I (HIGH SENSITIVITY): Troponin I (High Sensitivity): <2 ng/L (ref <18)

## 2022-05-24 MED ORDER — LIDOCAINE 5 % EX PTCH
1.0000 | MEDICATED_PATCH | CUTANEOUS | Status: DC
  Administered 2022-05-24: 1 via TRANSDERMAL
  Filled 2022-05-24: qty 1

## 2022-05-24 MED ORDER — LIDOCAINE 5 % EX PTCH: 1.0000 | MEDICATED_PATCH | Freq: Two times a day (BID) | CUTANEOUS | 0 refills | Status: AC

## 2022-05-24 MED ORDER — ACETAMINOPHEN 500 MG PO TABS
1000.0000 mg | ORAL_TABLET | Freq: Once | ORAL | Status: AC
  Administered 2022-05-24: 1000 mg via ORAL
  Filled 2022-05-24: qty 2

## 2022-05-24 MED ORDER — KETOROLAC TROMETHAMINE 30 MG/ML IJ SOLN
30.0000 mg | Freq: Once | INTRAMUSCULAR | Status: AC
  Administered 2022-05-24: 30 mg via INTRAMUSCULAR
  Filled 2022-05-24: qty 1

## 2022-05-24 MED ORDER — IBUPROFEN 600 MG PO TABS: 600.0000 mg | ORAL_TABLET | Freq: Four times a day (QID) | ORAL | 0 refills | Status: AC | PRN

## 2022-05-24 NOTE — ED Provider Notes (Signed)
[facility] Provider Note    Event Date/Time   First MD Initiated Contact with Patient 05/24/22 1522     (approximate)   History   Chest Pain and Shortness of Breath   HPI  Connie Webster is a 42 y.o. female who comes in with chest pain and shortness of breath.  Patient reports feeling some palpitations earlier had some pain on the left upper chest wall.  Occasionally some pain on the left back.  She reports some shortness of breath yesterday.  No shortness of breath at this time..  She denies any history of blood clots.  Denies any risk factors for PE.  She denies any birth control pills.  She denies any abdominal pain.  She denies anything that she could have done to strain the muscle.    Physical Exam   Triage Vital Signs: ED Triage Vitals  Enc Vitals Group     BP 05/24/22 1450 (!) 140/90     Pulse Rate 05/24/22 1450 91     Resp 05/24/22 1450 18     Temp 05/24/22 1450 98.3 F (36.8 C)     Temp Source 05/24/22 1450 Oral     SpO2 05/24/22 1450 100 %     Weight 05/24/22 1448 170 lb 10.2 oz (77.4 kg)     Height 05/24/22 1448 5\' 4"  (1.626 m)     Head Circumference --      Peak Flow --      Pain Score 05/24/22 1448 7     Pain Loc --      Pain Edu? --      Excl. in [place]? --     Most recent vital signs: Vitals:   05/24/22 1450  BP: (!) 140/90  Pulse: 91  Resp: 18  Temp: 98.3 F (36.8 C)  SpO2: 100%     General: Awake, no distress.  CV:  Good peripheral perfusion.  Resp:  Normal effort.  Abd:  No distention.  Soft and nontender Other:  No rash noted on the chest wall but she does have point tenderness on the left upper chest wall with palpation. No swelling in legs.  No calf tenderness  ED Results / Procedures / Treatments   Labs (all labs ordered are listed, but only abnormal results are displayed) Labs Reviewed  BASIC METABOLIC PANEL - Abnormal; Notable for the following components:      Result Value   Potassium 3.4  (*)    Glucose, Bld 143 (*)    All other components within normal limits  CBC  POC URINE PREG, ED  TROPONIN I (HIGH SENSITIVITY)     EKG  My interpretation of EKG:  Normal sinus rate 92 without any ST elevation, T wave inversion in lead III, normal intervals is somewhat to see all of her leads therefore I will get a repeat.  Repeat EKG is sinus rate of 72 without any ST elevation or T wave inversions, normal intervals  RADIOLOGY I have reviewed the xray personally and interpreted and no evidence of any pneumonia  PROCEDURES:  Critical Care performed: No  Procedures   MEDICATIONS ORDERED IN ED: Medications  ketorolac (TORADOL) 30 MG/ML injection 30 mg (has no administration in time range)  acetaminophen (TYLENOL) tablet 1,000 mg (has no administration in time range)  lidocaine (LIDODERM) 5 % 1 patch (has no administration in time range)     IMPRESSION / MDM / ASSESSMENT AND PLAN / ED COURSE  I reviewed the triage  vital signs and the nursing notes.   Patient's presentation is most consistent with acute presentation with potential threat to life or bodily function.   Patient comes in with some chest pain.  It is reproducible on examination.  No significant shortness of breath at this time oxygen levels 100%.  No wheezing on exam to suggest asthma exacerbation.  Suspect this is most likely musculoskeletal in nature.  Considered the possibility of PE but patient is PERC negative.  We discussed further testing with D-dimer, CT and patient would like to hold off at this time.  Will treat with Tylenol, Toradol, lidocaine patches and she will monitor symptoms at home and return if symptoms are worsening.  Will also get COVID and flu test.  No abdominal pain.  Troponin negative.  BMP slightly low potassium.  CBC reassuring.  Patient would like to be called if her results are positive and feels comfortable with discharge home.    FINAL CLINICAL IMPRESSION(S) / ED DIAGNOSES    Final diagnoses:  Atypical chest pain  Musculoskeletal pain     Rx / DC Orders   ED Discharge Orders          Ordered    ibuprofen (ADVIL) 600 MG tablet  Every 6 hours PRN        05/24/22 1602    lidocaine (LIDODERM) 5 %  Every 12 hours        05/24/22 1602             Note:  This document was prepared using Dragon voice recognition software and may include unintentional dictation errors.   Vanessa [place], MD 05/24/22 [id number]

## 2022-05-24 NOTE — ED Triage Notes (Signed)
Pt here with cp and SOB x3 days. Pt states she felt some palpitations earlier and the pain in the left side radiates to her back. Pt states she took her inhaler but it did not help with the pain.

## 2022-05-24 NOTE — Discharge Instructions (Addendum)
Take Tylenol 1 g every 8 hours ibuprofen with food with the lidocaine patches to see if it helps with pain.   We discussed further workup for pulmonary embolism but at this time based upon your vital signs and your medical history you are very low risk and no further workup is indicated however if you develop worsening shortness of breath or change in your symptoms and we would recommend you return to the ER for repeat evaluation.  Your COVID and flu test are still pending and we will call you if they are positive.

## 2022-05-30 DIAGNOSIS — S29012S Strain of muscle and tendon of back wall of thorax, sequela: Secondary | ICD-10-CM | POA: Diagnosis not present

## 2022-05-30 DIAGNOSIS — Z09 Encounter for follow-up examination after completed treatment for conditions other than malignant neoplasm: Secondary | ICD-10-CM | POA: Diagnosis not present

## 2022-05-30 DIAGNOSIS — M542 Cervicalgia: Secondary | ICD-10-CM | POA: Diagnosis not present

## 2022-09-05 DIAGNOSIS — M549 Dorsalgia, unspecified: Secondary | ICD-10-CM | POA: Diagnosis not present

## 2022-09-05 DIAGNOSIS — S29012S Strain of muscle and tendon of back wall of thorax, sequela: Secondary | ICD-10-CM | POA: Diagnosis not present

## 2022-09-05 DIAGNOSIS — M542 Cervicalgia: Secondary | ICD-10-CM | POA: Diagnosis not present

## 2023-02-12 DIAGNOSIS — M549 Dorsalgia, unspecified: Secondary | ICD-10-CM | POA: Diagnosis not present

## 2023-02-12 DIAGNOSIS — I10 Essential (primary) hypertension: Secondary | ICD-10-CM | POA: Diagnosis not present

## 2023-02-12 DIAGNOSIS — Z23 Encounter for immunization: Secondary | ICD-10-CM | POA: Diagnosis not present

## 2023-02-12 DIAGNOSIS — S29012S Strain of muscle and tendon of back wall of thorax, sequela: Secondary | ICD-10-CM | POA: Diagnosis not present

## 2023-02-12 DIAGNOSIS — M542 Cervicalgia: Secondary | ICD-10-CM | POA: Diagnosis not present

## 2023-02-14 ENCOUNTER — Other Ambulatory Visit: Payer: Self-pay | Admitting: Internal Medicine

## 2023-02-14 DIAGNOSIS — Z1231 Encounter for screening mammogram for malignant neoplasm of breast: Secondary | ICD-10-CM

## 2023-02-15 ENCOUNTER — Other Ambulatory Visit (HOSPITAL_COMMUNITY)
Admission: RE | Admit: 2023-02-15 | Discharge: 2023-02-15 | Disposition: A | Discharge status: Home / Self Care | Payer: BC Managed Care – PPO | Source: Ambulatory Visit | Attending: Certified Nurse Midwife | Admitting: Certified Nurse Midwife

## 2023-02-15 ENCOUNTER — Encounter: Payer: Self-pay | Admitting: Certified Nurse Midwife

## 2023-02-15 ENCOUNTER — Ambulatory Visit (INDEPENDENT_AMBULATORY_CARE_PROVIDER_SITE_OTHER): Payer: BC Managed Care – PPO | Admitting: Certified Nurse Midwife

## 2023-02-15 VITALS — BP 159/98 | HR 77 | Ht 63.0 in

## 2023-02-15 DIAGNOSIS — N898 Other specified noninflammatory disorders of vagina: Secondary | ICD-10-CM | POA: Insufficient documentation

## 2023-02-15 DIAGNOSIS — Z1231 Encounter for screening mammogram for malignant neoplasm of breast: Secondary | ICD-10-CM

## 2023-02-15 DIAGNOSIS — Z01419 Encounter for gynecological examination (general) (routine) without abnormal findings: Secondary | ICD-10-CM

## 2023-02-15 DIAGNOSIS — Z124 Encounter for screening for malignant neoplasm of cervix: Secondary | ICD-10-CM

## 2023-02-15 NOTE — Progress Notes (Signed)
GYNECOLOGY ANNUAL PREVENTATIVE CARE ENCOUNTER NOTE  History:     Connie Webster is a 42 y.o. [phone] female here for a routine annual gynecologic exam.  Current complaints: increased vaginal discharge.   Denies abnormal vaginal bleeding, discharge, pelvic pain, problems with intercourse or other gynecologic concerns.     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 Contraception: tubal ligation Last Pap: 09/11/2016. Results were: normal with negative HPV Last mammogram: has not had.   Obstetric History OB History  Gravida Para Term Preterm AB Living  4 3 2 1 1 3   SAB IAB Ectopic Multiple Live Births  1 0 0 0 3    # Outcome Date GA Lbr Len/2nd Weight Sex Type Anes PTL Lv  4 Preterm 02/20/20 [redacted]w[redacted]d 05:48 4 lb 9 oz (2.07 kg) F Vag-Spont EPI  LIV  3 Term 04/18/17 [redacted]w[redacted]d / 02:05 6 lb 6.5 oz (2.905 kg) F Vag-Spont EPI  LIV  2 Term 06/12/07 [redacted]w[redacted]d  7 lb 2 oz (3.232 kg) F Vag-Spont   LIV  1 SAB 2007 [redacted]w[redacted]d           Past Medical History:  Diagnosis Date   Anxiety    Asthma    frequently uses inhaler   Depression    takes meds   Elevated blood pressure affecting pregnancy, antepartum 03/06/2017   Gestational diabetes    diet controlled   Gestational diabetes    Hypertension    gestational    Umbilical hernia     Past Surgical History:  Procedure Laterality Date   DILATION AND CURETTAGE OF UTERUS     TUBAL LIGATION Bilateral 02/21/2020   Procedure: POST PARTUM TUBAL LIGATION;  Surgeon: Hildred Laser, MD;  Location: [facility] ORS;  Service: Gynecology;  Laterality: Bilateral;    Current Outpatient Medications on File Prior to Visit  Medication Sig Dispense Refill   albuterol (PROVENTIL HFA;VENTOLIN HFA) 108 (90 BASE) MCG/ACT inhaler Inhale 2  puffs into the lungs every 4 (four) hours as needed for wheezing or shortness of breath.     cyclobenzaprine (FLEXERIL) 10 MG tablet Take by mouth.     naproxen (NAPROSYN) 500 MG tablet Take by mouth.     predniSONE (DELTASONE) 20 MG tablet Take 2 tablets by mouth daily for 5 days followed by 1 tablet by mouth daily for 5 days and stop     No current facility-administered medications on file prior to visit.    Allergies  Allergen Reactions   Peanut-Containing Drug Products Anaphylaxis    Social History:  reports that she has never smoked. She has never used smokeless tobacco. She reports that she does not drink alcohol and does not use drugs.  Family History  Problem Relation Age of Onset   Epilepsy Father    Heart attack Father    Epilepsy Brother     The following portions of the patient's history were reviewed and updated as appropriate: allergies, current medications, past family history, past medical history, past social history, past surgical history and problem list.  Review of Systems Pertinent items noted in HPI and remainder of comprehensive ROS otherwise negative.  Physical Exam:  BP (!) 154/101   Pulse 84   Ht 5\' 3"  (1.6 m) Comment: patient reports  LMP 01/24/2023 (Exact Date)   Breastfeeding  No   BMI 30.23 kg/m  CONSTITUTIONAL: Well-developed, well-nourished female in no acute distress.  HENT:  Normocephalic, atraumatic, External right and left ear normal. Oropharynx is clear and moist EYES: Conjunctivae and EOM are normal. Pupils are equal, round, and reactive to light. No scleral icterus.  NECK: Normal range of motion, supple, no masses.  Normal thyroid.  SKIN: Skin is warm and dry. No rash noted. Not diaphoretic. No erythema. No pallor. MUSCULOSKELETAL: Normal range of motion. No tenderness.  No cyanosis, clubbing, or edema.  2+ distal pulses. NEUROLOGIC: Alert and oriented to person, place, and time. Normal reflexes, muscle tone coordination.  PSYCHIATRIC:  Normal mood and affect. Normal behavior. Normal judgment and thought content. CARDIOVASCULAR: Normal heart rate noted, regular rhythm RESPIRATORY: Clear to auscultation bilaterally. Effort and breath sounds normal, no problems with respiration noted. BREASTS: Symmetric in size. No masses, tenderness, skin changes, nipple drainage, or lymphadenopathy bilaterally.  ABDOMEN: Soft, no distention noted.  No tenderness, rebound or guarding.  PELVIC: Normal appearing external genitalia and urethral meatus; normal appearing vaginal mucosa and cervix.  No abnormal discharge noted.  Pap smear obtained.  Normal uterine size, no other palpable masses, no uterine or adnexal tenderness.  .   Assessment and Plan:    1. Women's annual routine gynecological examination  Pap:Will follow up results of pap smear and manage accordingly. Mammogram : scheduled  Labs: vaginal swab Refills: none Referral: none  Routine preventative health maintenance measures emphasized. Please refer to After Visit Summary for other counseling recommendations.      Doreene Burke, CNM [facility]  [facility],  [facility]

## 2023-02-15 NOTE — Patient Instructions (Signed)
Preventive Care 40-42 Years Old, Female Preventive care refers to lifestyle choices and visits with your health care provider that can promote health and wellness. Preventive care visits are also called wellness exams. What can I expect for my preventive care visit? Counseling Your health care provider may ask you questions about your: Medical history, including: Past medical problems. Family medical history. Pregnancy history. Current health, including: Menstrual cycle. Method of birth control. Emotional well-being. Home life and relationship well-being. Sexual activity and sexual health. Lifestyle, including: Alcohol, nicotine or tobacco, and drug use. Access to firearms. Diet, exercise, and sleep habits. Work and work environment. Sunscreen use. Safety issues such as seatbelt and bike helmet use. Physical exam Your health care provider will check your: Height and weight. These may be used to calculate your BMI (body mass index). BMI is a measurement that tells if you are at a healthy weight. Waist circumference. This measures the distance around your waistline. This measurement also tells if you are at a healthy weight and may help predict your risk of certain diseases, such as type 2 diabetes and high blood pressure. Heart rate and blood pressure. Body temperature. Skin for abnormal spots. What immunizations do I need?  Vaccines are usually given at various ages, according to a schedule. Your health care provider will recommend vaccines for you based on your age, medical history, and lifestyle or other factors, such as travel or where you work. What tests do I need? Screening Your health care provider may recommend screening tests for certain conditions. This may include: Lipid and cholesterol levels. Diabetes screening. This is done by checking your blood sugar (glucose) after you have not eaten for a while (fasting). Pelvic exam and Pap test. Hepatitis B test. Hepatitis C  test. HIV (human immunodeficiency virus) test. STI (sexually transmitted infection) testing, if you are at risk. Lung cancer screening. Colorectal cancer screening. Mammogram. Talk with your health care provider about when you should start having regular mammograms. This may depend on whether you have a family history of breast cancer. BRCA-related cancer screening. This may be done if you have a family history of breast, ovarian, tubal, or peritoneal cancers. Bone density scan. This is done to screen for osteoporosis. Talk with your health care provider about your test results, treatment options, and if necessary, the need for more tests. Follow these instructions at home: Eating and drinking  Eat a diet that includes fresh fruits and vegetables, whole grains, lean protein, and low-fat dairy products. Take vitamin and mineral supplements as recommended by your health care provider. Do not drink alcohol if: Your health care provider tells you not to drink. You are pregnant, may be pregnant, or are planning to become pregnant. If you drink alcohol: Limit how much you have to 0-1 drink a day. Know how much alcohol is in your drink. In the [place] one drink equals one 12 oz bottle of beer (355 mL), one 5 oz glass of wine (148 mL), or one 1 oz glass of hard liquor (44 mL). Lifestyle Brush your teeth every morning and night with fluoride toothpaste. Floss one time each day. Exercise for at least 30 minutes 5 or more days each week. Do not use any products that contain nicotine or tobacco. These products include cigarettes, chewing tobacco, and vaping devices, such as e-cigarettes. If you need help quitting, ask your health care provider. Do not use drugs. If you are sexually active, practice safe sex. Use a condom or other form of protection to   prevent STIs. If you do not wish to become pregnant, use a form of birth control. If you plan to become pregnant, see your health care provider for a  prepregnancy visit. Take aspirin only as told by your health care provider. Make sure that you understand how much to take and what form to take. Work with your health care provider to find out whether it is safe and beneficial for you to take aspirin daily. Find healthy ways to manage stress, such as: Meditation, yoga, or listening to music. Journaling. Talking to a trusted person. Spending time with friends and family. Minimize exposure to UV radiation to reduce your risk of skin cancer. Safety Always wear your seat belt while driving or riding in a vehicle. Do not drive: If you have been drinking alcohol. Do not ride with someone who has been drinking. When you are tired or distracted. While texting. If you have been using any mind-altering substances or drugs. Wear a helmet and other protective equipment during sports activities. If you have firearms in your house, make sure you follow all gun safety procedures. Seek help if you have been physically or sexually abused. What's next? Visit your health care provider once a year for an annual wellness visit. Ask your health care provider how often you should have your eyes and teeth checked. Stay up to date on all vaccines. This information is not intended to replace advice given to you by your health care provider. Make sure you discuss any questions you have with your health care provider. Document Revised: 10/05/2020 Document Reviewed: 10/05/2020 Elsevier Patient Education  2024 [employer]  

## 2023-02-18 LAB — CERVICOVAGINAL ANCILLARY ONLY
Bacterial Vaginitis (gardnerella): NEGATIVE
Candida Glabrata: NEGATIVE
Candida Vaginitis: POSITIVE — AB
Chlamydia: NEGATIVE
Comment: NEGATIVE
Comment: NEGATIVE
Comment: NEGATIVE
Comment: NEGATIVE
Comment: NEGATIVE
Comment: NORMAL
Neisseria Gonorrhea: NEGATIVE
Trichomonas: NEGATIVE

## 2023-02-19 ENCOUNTER — Encounter: Payer: Self-pay | Admitting: Certified Nurse Midwife

## 2023-02-19 ENCOUNTER — Other Ambulatory Visit: Payer: Self-pay | Admitting: Certified Nurse Midwife

## 2023-02-19 LAB — CYTOLOGY - PAP
Comment: NEGATIVE
Diagnosis: NEGATIVE
High risk HPV: NEGATIVE

## 2023-02-19 MED ORDER — FLUCONAZOLE 150 MG PO TABS: 150.0000 mg | ORAL_TABLET | Freq: Once | ORAL | 0 refills | Status: AC

## 2023-02-21 NOTE — Progress Notes (Signed)
Referring Physician:  Enid Baas, MD [address] [place],  [place] [address]  Primary Physician:  Enid Baas, MD  History of Present Illness: 02/25/2023 Connie Webster has a history of asthma, depression, anxiety, HTN.   2+ year history of constant mid back pain with no radicular symptoms. No known injury. Pain is worse with bending and twisting (cannot give kids a bath). She has a brace (sounds like a TLSO) that helps. Pain is between her shoulder blades. She can feel intermittent popping in her right shoulder. No numbness or tingling. No weakness in her arms/legs.   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   Given prednisone taper by PCP on 02/12/23- she took some of it and stopped due to side effects. Not taking flexeril.   She does not smoke.   Bowel/Bladder Dysfunction: none  Conservative measures:  Physical therapy: did PT at [facility] last year.  Multimodal medical therapy including regular antiinflammatories: mobic, flexeril, naproxen, prednisone, skelaxin Injections: no epidural steroid injections  Past Surgery: no previous spinal surgeries   Connie Webster has no symptoms of cervical myelopathy.  The symptoms are causing a significant impact on the patient's life.   Review of Systems:  A 10 point review of systems is negative, except for the pertinent positives and negatives detailed in the HPI.  Past Medical History: Past Medical History:  Diagnosis Date   Anxiety    Asthma    frequently uses inhaler   Depression    takes meds   Elevated blood pressure affecting pregnancy, antepartum 03/06/2017   Gestational diabetes    diet controlled   Gestational diabetes    Hypertension    gestational    Umbilical hernia     Past Surgical History: Past Surgical History:  Procedure Laterality Date   DILATION AND CURETTAGE OF UTERUS     TUBAL LIGATION Bilateral 02/21/2020   Procedure: POST PARTUM TUBAL  LIGATION;  Surgeon: Hildred Laser, MD;  Location: [facility] ORS;  Service: Gynecology;  Laterality: Bilateral;    Allergies: Allergies as of 02/25/2023 - Review Complete 02/15/2023  Allergen Reaction Noted   Peanut-containing drug products Anaphylaxis 03/17/2017    Medications: Outpatient Encounter Medications as of 02/25/2023  Medication Sig   albuterol (PROVENTIL HFA;VENTOLIN HFA) 108 (90 BASE) MCG/ACT inhaler Inhale 2 puffs into the lungs every 4 (four) hours as needed for wheezing or shortness of breath.   [EXPIRED] cyclobenzaprine (FLEXERIL) 10 MG tablet Take by mouth.   naproxen (NAPROSYN) 500 MG tablet Take by mouth.   predniSONE (DELTASONE) 20 MG tablet Take 2 tablets by mouth daily for 5 days followed by 1 tablet by mouth daily for 5 days and stop   No facility-administered encounter medications on file as of 02/25/2023.    Social History: Social History   Tobacco Use   Smoking status: Never   Smokeless tobacco: Never  Vaping Use   Vaping status: Never Used  Substance Use Topics   Alcohol use: No   Drug use: No    Family Medical History: Family History  Problem Relation Age of Onset   Epilepsy Father    Heart attack Father    Epilepsy Brother     Physical Examination: There were no vitals filed for this visit.  General: Patient is well developed, well nourished, calm, collected, and in no apparent distress. Attention to examination is appropriate.  Respiratory: Patient is breathing without any difficulty.   NEUROLOGICAL:     Awake, alert, oriented to  person, place, and time.  Speech is clear and fluent. Fund of knowledge is appropriate.   Cranial Nerves: Pupils equal round and reactive to light.  Facial tone is symmetric.    No posterior cervical tenderness. Mild tenderness in right trapezial region.   She has limited painful ROM of the right shoulder. Good ROM of left shoulder with no pain.   Has pain with stress of rotator cuff on right.   Has medial  scapular tenderness bilaterally, but worse on right.   No abnormal lesions on exposed skin.   Strength: Side Biceps Triceps Deltoid Interossei Grip Wrist Ext. Wrist Flex.  R 5 5 5 5 5 5 5   L 5 5 5 5 5 5 5    Side Iliopsoas Quads Hamstring PF DF EHL  R 5 5 5 5 5 5   L 5 5 5 5 5 5    Reflexes are 2+ and symmetric at the biceps, brachioradialis, patella and achilles.   Hoffman's is absent.  Clonus is not present.   Bilateral upper and lower extremity sensation is intact to light touch.     Gait is normal.     Medical Decision Making  Imaging: None   Assessment and Plan: Connie Webster is a pleasant 42 y.o. female who has 2+ year history of constant mid back pain with no radicular symptoms. Pain is between her shoulder blades. She can feel intermittent popping in her right shoulder. No numbness or tingling. No weakness in her arms/legs.   No imaging available.   She has limited painful ROM of right shoulder and this recreates most of her pain. I think a lot of her pain is shoulder mediated, but she may still have some underlying thoracic myofascial pain.   Treatment options discussed with patient and following plan made:   - Referral to ortho to evaluate right shoulder pain. Orders to [facility].  - Will get cervical/thoracic xrays from [facility] and message her with further plan and follow up.  - Discussed medications. She does not like to take them and is worried about side effects (she is home with 42 year old). Will hold off on any further prescriptions.   I spent a total of 25 minutes in face-to-face and non-face-to-face activities related to this patient's care today including review of outside records, review of imaging, review of symptoms, physical exam, discussion of differential diagnosis, discussion of treatment options, and documentation.   Thank you for involving me in the care of this patient.   Drake Leach PA-C Dept. of Neurosurgery

## 2023-02-25 ENCOUNTER — Inpatient Hospital Stay
Admission: RE | Admit: 2023-02-25 | Discharge: 2023-02-25 | Disposition: A | Discharge status: Home / Self Care | Payer: Self-pay | Source: Ambulatory Visit | Attending: Orthopedic Surgery | Admitting: Orthopedic Surgery

## 2023-02-25 ENCOUNTER — Ambulatory Visit (INDEPENDENT_AMBULATORY_CARE_PROVIDER_SITE_OTHER): Payer: BC Managed Care – PPO | Admitting: Orthopedic Surgery

## 2023-02-25 ENCOUNTER — Encounter: Payer: Self-pay | Admitting: Orthopedic Surgery

## 2023-02-25 ENCOUNTER — Other Ambulatory Visit: Payer: Self-pay

## 2023-02-25 VITALS — BP 138/88 | Ht 63.0 in | Wt 165.6 lb

## 2023-02-25 DIAGNOSIS — M546 Pain in thoracic spine: Secondary | ICD-10-CM | POA: Diagnosis not present

## 2023-02-25 DIAGNOSIS — G8929 Other chronic pain: Secondary | ICD-10-CM

## 2023-02-25 DIAGNOSIS — M25511 Pain in right shoulder: Secondary | ICD-10-CM | POA: Diagnosis not present

## 2023-02-25 DIAGNOSIS — Z049 Encounter for examination and observation for unspecified reason: Secondary | ICD-10-CM

## 2023-02-25 DIAGNOSIS — M7918 Myalgia, other site: Secondary | ICD-10-CM | POA: Diagnosis not present

## 2023-02-25 NOTE — Patient Instructions (Signed)
It was so nice to see you today. Thank you so much for coming in.    I think a lot of your pain is coming from your right shoulder. You have limited, painful range of motion of the shoulder.   I want you to see ortho at the [facility] clinic for evaluation of the right shoulder. They should call you to schedule an appointment or you can call them at [phone].   I will get your neck and mid back xrays from the [facility] and review them. I will send you a message and we can regroup.   Please do not hesitate to call if you have any questions or concerns. You can also message me in MyChart.   Drake Leach PA-C [phone]     The physicians and staff at [facility] are committed to providing excellent care. You may receive a survey asking for feedback about your experience at our office. We value you your feedback and appreciate you taking the time to to fill it out. The [facility] leadership team is also available to discuss your experience in person, feel free to contact us [phone].

## 2023-03-08 ENCOUNTER — Ambulatory Visit
Admission: RE | Admit: 2023-03-08 | Discharge: 2023-03-08 | Disposition: A | Discharge status: Home / Self Care | Payer: BC Managed Care – PPO | Source: Ambulatory Visit

## 2023-03-08 DIAGNOSIS — Z1231 Encounter for screening mammogram for malignant neoplasm of breast: Secondary | ICD-10-CM

## 2023-03-11 DIAGNOSIS — M75111 Incomplete rotator cuff tear or rupture of right shoulder, not specified as traumatic: Secondary | ICD-10-CM | POA: Diagnosis not present

## 2023-03-11 DIAGNOSIS — M25811 Other specified joint disorders, right shoulder: Secondary | ICD-10-CM | POA: Diagnosis not present

## 2023-03-13 NOTE — Telephone Encounter (Signed)
She has not been able to get into her mychart. She saw Dr.Menz for her shoulder. She will be starting physical therapy for her shoulder this week for 4 weeks and then see Dr.Menz again.

## 2023-03-13 NOTE — Telephone Encounter (Signed)
Sounds good

## 2023-03-14 ENCOUNTER — Ambulatory Visit: Payer: BC Managed Care – PPO | Attending: Orthopedic Surgery | Admitting: Physical Therapy

## 2023-03-14 VITALS — BP 158/92

## 2023-03-14 DIAGNOSIS — M25511 Pain in right shoulder: Secondary | ICD-10-CM | POA: Insufficient documentation

## 2023-03-14 DIAGNOSIS — G8929 Other chronic pain: Secondary | ICD-10-CM | POA: Diagnosis not present

## 2023-03-14 NOTE — Therapy (Signed)
OUTPATIENT PHYSICAL THERAPY SHOULDER EVALUATION   Patient Name: Connie Webster MRN: [id number] DOB:1980-12-11, 42 y.o., female Today's Date: 03/14/2023  END OF SESSION:  PT End of Session - 03/14/23 1408     Visit Number 1    Number of Visits 20    Date for PT Re-Evaluation 05/23/23    Authorization Type [employer]    Authorization Time Period 11/22/2022-11/21/2023    Authorization - Visit Number 1    Authorization - Number of Visits 20    Progress Note Due on Visit 10    PT Start Time 1300    PT Stop Time 1350    PT Time Calculation (min) 50 min    Activity Tolerance Patient tolerated treatment well    Behavior During Therapy [facility] for tasks assessed/performed             Past Medical History:  Diagnosis Date   Anxiety    Asthma    frequently uses inhaler   Depression    takes meds   Elevated blood pressure affecting pregnancy, antepartum 03/06/2017   Gestational diabetes    diet controlled   Gestational diabetes    Hypertension    gestational    Umbilical hernia    Past Surgical History:  Procedure Laterality Date   DILATION AND CURETTAGE OF UTERUS     TUBAL LIGATION Bilateral 02/21/2020   Procedure: POST PARTUM TUBAL LIGATION;  Surgeon: Hildred Laser, MD;  Location: [facility] ORS;  Service: Gynecology;  Laterality: Bilateral;   Patient Active Problem List   Diagnosis Date Noted   Vitamin D deficiency 09/27/2019   History of UTI 09/25/2019   Umbilical hernia 02/05/2017    PCP: Enid Baas, MD   REFERRING PROVIDER: Kennedy Bucker, MD   REFERRING DIAG: Diagnosis M25.811 (ICD-10-CM) - Other specified joint disorders, right shoulder M75.111 (ICD-10-CM) - Incomplete rotator cuff tear or rupture of right shoulder, not specified as traumatic    THERAPY DIAG:  Chronic right shoulder pain  Rationale for Evaluation and Treatment: Rehabilitation  ONSET DATE: More than 4 years ago  SUBJECTIVE:                                                                                                                                                                                       SUBJECTIVE STATEMENT: See pertinent history  PERTINENT HISTORY: Right shoulder pain, "was having trouble with scapula". Lifelong swimming, but stopped 4 years. Did butterfly and backstroke. Pain has been at this level for about 2 years. Has been limiting caring for 42 year old, house work, swimming.   PAIN:  Are you having pain? Yes: NPRS scale: 6/10 Pain location: Front and  back of shoulder and around scapula Pain description: Can feel like pinching, sharp, and/or sore Aggravating factors: Moving right arm, cleaning around the house Relieving factors: Ice  PRECAUTIONS: None  RED FLAGS: None   WEIGHT BEARING RESTRICTIONS: No  FALLS:  Has patient fallen in last 6 months? No  LIVING ENVIRONMENT: Lives with: lives with their family Lives in: House/apartment Stairs: Yes: External: 3 steps; on left going up Has following equipment at home: None  OCCUPATION: Not working  PLOF: Independent  PATIENT GOALS: Better range, more use of right UE, decrease pain, swimming  NEXT MD VISIT: Not sure   OBJECTIVE:  Note: Objective measures were completed at Evaluation unless otherwise noted.  DIAGNOSTIC FINDINGS:  "AP, scapular Y, and axillary view x-rays of the right shoulder were ordered and personally reviewed today. These show some superior subluxation of the clavicle at the [facility] joint, a tiny osteophyte at the inferior humeral head, no joint space narrowing. No evidence of any decreased subacromial space on the scapular Y, and some sclerosis at the tuberosity, otherwise essentially normal right shoulder.   Document Attestation: I, Dawn Royse, have reviewed and updated documentation for [facility], MD, utilizing N[employer] DAX. "  PATIENT SURVEYS:  FOTO 32 with goal of 60  COGNITION: Overall cognitive status: Within functional limits for tasks  assessed     SENSATION: WFL   UPPER EXTREMITY ROM:   Active ROM Right eval Left eval  Shoulder flexion 130/140*Pain starting at 60 deg [facility]  Shoulder extension    Shoulder abduction 70/110 "  Shoulder adduction    Shoulder internal rotation 70/90* "  Shoulder external rotation 60/70 "  Elbow flexion    Elbow extension    Wrist flexion    Wrist extension    Wrist ulnar deviation    Wrist radial deviation    Wrist pronation    Wrist supination    (Blank rows = not tested)  Cervical Ext - WFL but feels tight/strange R lat flex - 25 discordant pain   UPPER EXTREMITY MMT:  MMT Right eval Left eval  Shoulder flexion 3- 5  Shoulder extension (seated) 2 4+  Shoulder abduction 3- 4+  Shoulder adduction    Shoulder internal rotation 2 4  Shoulder external rotation 2 4  Middle trapezius    Lower trapezius    Elbow flexion    Elbow extension    Wrist flexion    Wrist extension    Wrist ulnar deviation    Wrist radial deviation    Wrist pronation    Wrist supination    Grip strength (lbs)    (Blank rows = not tested)  SHOULDER SPECIAL TESTS:  SLAP lesions: Biceps load test: positive   Rotator cuff assessment: External rotation lag sign: negative, Belly press test: negative, and Lateral job: positive  Biceps assessment: Speed's test: positive   JOINT MOBILITY TESTING:  Deferred  PALPATION:  Trigger points around parascapular musculature   TODAY'S TREATMENT:  DATE:  03/14/23 Right ER isometric 1x10 Left IR isometric 1x10   PATIENT EDUCATION: Education details: Pt educated on course of PT, test findings, HEP (how to perform, how to access) Person educated: Patient Education method: Explanation Education comprehension: verbalized understanding  HOME EXERCISE PROGRAM: Access Code: [email or web address] URL:  [email or web address] Date: 03/14/2023 Prepared by: Ellin Goodie  Exercises - Isometric Shoulder External Rotation at Wall  - 1 x daily - 3 sets - 10 reps - 3 sec  hold - Standing Isometric Shoulder Internal Rotation at Doorway  - 1 x daily - 3 sets - 10 reps - 3 sec hold  ASSESSMENT:  CLINICAL IMPRESSION: Patient is a 42 y.o. AA female who was seen today for physical therapy evaluation and treatment for chronic right shoulder pain. Pt signs and symptoms are consistent with a right labral injury as evidenced by clicking and popping, feelings of instability with internal and external rotation, limited AROM and PROM, and positive biceps load 1 test.  Subscapularis injury ruled out via belly press test; infraspinatus injury ruled out via ER lag sign; supraspinatus injury possible as evidenced by positive lateral jobe (SP 89%). Biceps tendinopathy cannot be ruled out due to the positive speed's test, but cannot be ruled in as this is not a specific test (SP 13.8%). Previous imaging shows slight superior subluxation at [facility] joint, which was supported by pain with horizontal adduction of right UE.This could also contribute to her deficits, which include decreased right shoulder range of motion and strength with increased pain. Deficits have limited pt's ability to engage in any activity involving her right arm such as carrying, lifting, reaching overhead and more. This has restricted her ability to participate in activities around the house, caring for her child, gardening, and swimming. PT would be beneficial to address these deficits to help her improve her ability to perform ADLs, swim, and care for her child with less pain.     OBJECTIVE IMPAIRMENTS: decreased ROM, decreased strength, and pain.   ACTIVITY LIMITATIONS: carrying, lifting, sleeping, transfers, dressing, self feeding, reach over head, hygiene/grooming, and caring for others  PARTICIPATION LIMITATIONS: meal prep, cleaning,  laundry, driving, shopping, occupation, and yard work  PERSONAL FACTORS: Time since onset of injury/illness/exacerbation are also affecting patient's functional outcome.   REHAB POTENTIAL: Fair pt is relatively young, but has been having pain for several years  CLINICAL DECISION MAKING: Stable/uncomplicated  EVALUATION COMPLEXITY: Low   GOALS: Goals reviewed with patient? No  SHORT TERM GOALS: Target date: 03/28/2023    Patient will demonstrate independent understanding of HEP to be able effectively manage underlying MSK condition.   Baseline: Goal status: INITIAL   LONG TERM GOALS: Target date: 05/23/2023    Patient will have improved function and activity level as evidenced by an increase in FOTO score by 10 points or more.   Baseline: (Need FOTO score here) Goal status: INITIAL  2.  Pt will increase right shoulder ROM to be within 10% of uninvolved side in order to increase function and allow her to better participate in caring for her child and return to swimming  Baseline:  Shoulder flexion 130/140*Pain starting at 60 deg [facility]  Shoulder abduction 70/110 "  Shoulder internal rotation 70/90* "  Shoulder external rotation 60/70 "   Goal status: INITIAL  3.  Pt will increase right parascapular strength in MMT by 1/3 (i.e. 4 to 4+) in order to increase increase function and improve ability to participate in hobbies of swimming and gardening and function in  ADL's Baseline:  Shoulder flexion 3- 5  Shoulder extension (seated) 2 4+  Shoulder abduction 3- 4+  Shoulder internal rotation 2 4  Shoulder external rotation 2 4   Goal status: INITIAL  4.  Pt will be able to lift and carry 20 lbs using BUE without increase in pain >3/10 on NPRSin order to increase increase function and improve ability to participate in hobbies of swimming and gardening and function in ADL's Baseline: Unable to test Goal status: INITIAL  5.  Pt will be able to lift 10# overhead with RUE without  pain increasing >3/10 on NPRS in order to increase increase function and improve ability to participate in hobbies of swimming and gardening and function in ADL's  Baseline: 0 lbs Goal status: INITIAL   PLAN:  PT FREQUENCY: 1-2x/week  PT DURATION: 10 weeks  PLANNED INTERVENTIONS: 97164- PT Re-evaluation, 97110-Therapeutic exercises, 97530- Therapeutic activity, 97112- Neuromuscular re-education, 97535- Self Care, 64403- Manual therapy, and Joint mobilization  PLAN FOR NEXT SESSION: Incorporate light shoulder and periscapular training, AAROM (pulleys, rods, towel slides)   Arcelia Jew, Student-PT 03/14/2023, 3:41 PM

## 2023-03-19 ENCOUNTER — Ambulatory Visit: Payer: BC Managed Care – PPO | Admitting: Physical Therapy

## 2023-03-19 DIAGNOSIS — G8929 Other chronic pain: Secondary | ICD-10-CM | POA: Diagnosis not present

## 2023-03-19 DIAGNOSIS — M25511 Pain in right shoulder: Secondary | ICD-10-CM | POA: Diagnosis not present

## 2023-03-19 NOTE — Therapy (Signed)
OUTPATIENT PHYSICAL THERAPY SHOULDER TREATMENT   Patient Name: Connie Webster MRN: [id number] DOB:01-18-1981, 42 y.o., female Today's Date: 03/19/2023  END OF SESSION:  PT End of Session - 03/19/23 1303     Visit Number 2    Number of Visits 20    Date for PT Re-Evaluation 05/23/23    Authorization Type BCBS    Authorization Time Period 11/22/2022-11/21/2023    Authorization - Visit Number 2    Authorization - Number of Visits 20    Progress Note Due on Visit 10    PT Start Time 1031    PT Stop Time 1114    PT Time Calculation (min) 43 min    Activity Tolerance Patient tolerated treatment well    Behavior During Therapy WFL for tasks assessed/performed              Past Medical History:  Diagnosis Date   Anxiety    Asthma    frequently uses inhaler   Depression    takes meds   Elevated blood pressure affecting pregnancy, antepartum 03/06/2017   Gestational diabetes    diet controlled   Gestational diabetes    Hypertension    gestational    Umbilical hernia    Past Surgical History:  Procedure Laterality Date   DILATION AND CURETTAGE OF UTERUS     TUBAL LIGATION Bilateral 02/21/2020   Procedure: POST PARTUM TUBAL LIGATION;  Surgeon: Hildred Laser, MD;  Location: [facility] ORS;  Service: Gynecology;  Laterality: Bilateral;   Patient Active Problem List   Diagnosis Date Noted   Vitamin D deficiency 09/27/2019   History of UTI 09/25/2019   Umbilical hernia 02/05/2017    PCP: Enid Baas, MD   REFERRING PROVIDER: Kennedy Bucker, MD   REFERRING DIAG: Diagnosis M25.811 (ICD-10-CM) - Other specified joint disorders, right shoulder M75.111 (ICD-10-CM) - Incomplete rotator cuff tear or rupture of right shoulder, not specified as traumatic    THERAPY DIAG:  Chronic right shoulder pain  Rationale for Evaluation and Treatment: Rehabilitation  ONSET DATE: More than 4 years ago  SUBJECTIVE:                                                                                                                                                                                       SUBJECTIVE STATEMENT: Pt reports HEP hurt while doing ER isometrics but didn't make things worse. She also reports feelings of instability with ER isometrics. Has new feeling of "pulling" on posterior shoulder.  PERTINENT HISTORY: Right shoulder pain, "was having trouble with scapula". Lifelong swimming, but stopped 4 years. Did butterfly and backstroke. Pain has been at this level for about 2  years. Has been limiting caring for 42 year old, house work, swimming.   PAIN:  Are you having pain? Yes: NPRS scale: 6/10 Pain location: Front and back of shoulder and around scapula Pain description: Can feel like pinching, sharp, and/or sore Aggravating factors: Moving right arm, cleaning around the house Relieving factors: Ice  PRECAUTIONS: None  RED FLAGS: None   WEIGHT BEARING RESTRICTIONS: No  FALLS:  Has patient fallen in last 6 months? No  LIVING ENVIRONMENT: Lives with: lives with their family Lives in: House/apartment Stairs: Yes: External: 3 steps; on left going up Has following equipment at home: None  OCCUPATION: Not working  PLOF: Independent  PATIENT GOALS: Better range, more use of right UE, decrease pain, swimming  NEXT MD VISIT: Not sure   OBJECTIVE:  Note: Objective measures were completed at Evaluation unless otherwise noted.  DIAGNOSTIC FINDINGS:  "AP, scapular Y, and axillary view x-rays of the right shoulder were ordered and personally reviewed today. These show some superior subluxation of the clavicle at the [facility] joint, a tiny osteophyte at the inferior humeral head, no joint space narrowing. No evidence of any decreased subacromial space on the scapular Y, and some sclerosis at the tuberosity, otherwise essentially normal right shoulder.   Document Attestation: I, Dawn Royse, have reviewed and updated documentation for [facility], MD, utilizing Nuance DAX. "  PATIENT SURVEYS:  FOTO 32 with goal of 60  COGNITION: Overall cognitive status: Within functional limits for tasks assessed     SENSATION: WFL   UPPER EXTREMITY ROM:   Active ROM Right eval Left eval  Shoulder flexion 130/140*Pain starting at 60 deg [facility]  Shoulder extension    Shoulder abduction 70/110 "  Shoulder adduction    Shoulder internal rotation 70/90* "  Shoulder external rotation 60/70 "  Elbow flexion    Elbow extension    Wrist flexion    Wrist extension    Wrist ulnar deviation    Wrist radial deviation    Wrist pronation    Wrist supination    (Blank rows = not tested)  Cervical Ext - WFL but feels tight/strange R lat flex - 25 discordant pain   UPPER EXTREMITY MMT:  MMT Right eval Left eval  Shoulder flexion 3- 5  Shoulder extension (seated) 2 4+  Shoulder abduction 3- 4+  Shoulder adduction    Shoulder internal rotation 2 4  Shoulder external rotation 2 4  Middle trapezius    Lower trapezius    Elbow flexion    Elbow extension    Wrist flexion    Wrist extension    Wrist ulnar deviation    Wrist radial deviation    Wrist pronation    Wrist supination    Grip strength (lbs) 12# (@ grip distance 1) 03/19/23 58# (@1 ) (78 # @ 2) 03/19/23  (Blank rows = not tested)  SHOULDER SPECIAL TESTS:  SLAP lesions: Biceps load test: positive   Rotator cuff assessment: External rotation lag sign: negative, Belly press test: negative, and Lateral job: positive  Biceps assessment: Speed's test: positive   JOINT MOBILITY TESTING:  Deferred  PALPATION:  Trigger points around parascapular musculature   TODAY'S TREATMENT:  DATE:  03/19/23  All single arm exercises done on RUE Pendulums 3x30s Supine resisted ER with yellow theraband 3x15 with 3 sec holds Abduction to  20 deg 1x15  - pain at front of shoulder Abduction to 45 deg with wrist flexion 2x25  - no pain, feels muscles working Resisted IR with yellow theraband 2x25  - no pain if wrist remains flexed   03/14/23 Right ER isometric 1x10 Right IR isometric 1x10   PATIENT EDUCATION: Education details: Pt educated on course of PT, test findings, HEP (how to perform, how to access) Person educated: Patient Education method: Explanation Education comprehension: verbalized understanding  HOME EXERCISE PROGRAM: Access Code: [email or web address] URL: [email or web address] Date: 03/19/2023 Prepared by: Ellin Goodie  Exercises - Shoulder External Rotation with Anchored Resistance  - 1 x daily - 3 sets - 20 reps - Standing Single Arm Shoulder Abduction with Resistance  - 1 x daily - 3 sets - 20 reps - Standing Shoulder Internal Rotation with Anchored Resistance  - 1 x daily - 3 sets - 20 reps  ASSESSMENT:  CLINICAL IMPRESSION: Patient is a 42 y.o. AA female who was seen today for physical therapy treatment for chronic right shoulder pain. PT focused on rotator cuff strength and endurance exercises within a pain free range of motion. Performing exercises with wrist flexion allows for increased shoulder internal rotator activation which prevents excessive anterior translation of humeral head and prevents right shoulder pain. Pt was successful in performing these motions with added resistance, more range, and less pain compared to eval. This shows progress towards goals. PT would be beneficial to address these deficits to help her improve her ability to perform ADLs, swim, and care for her child with less pain.    OBJECTIVE IMPAIRMENTS: decreased ROM, decreased strength, and pain.   ACTIVITY LIMITATIONS: carrying, lifting, sleeping, transfers, dressing, self feeding, reach over head, hygiene/grooming, and caring for others  PARTICIPATION LIMITATIONS: meal prep, cleaning, laundry, driving,  shopping, occupation, and yard work  PERSONAL FACTORS: Time since onset of injury/illness/exacerbation are also affecting patient's functional outcome.   REHAB POTENTIAL: Fair pt is relatively young, but has been having pain for several years  CLINICAL DECISION MAKING: Stable/uncomplicated  EVALUATION COMPLEXITY: Low   GOALS: Goals reviewed with patient? No  SHORT TERM GOALS: Target date: 03/28/2023    Patient will demonstrate independent understanding of HEP to be able effectively manage underlying MSK condition.   Baseline: Performing independently   Goal status: ACHIEVED    LONG TERM GOALS: Target date: 05/23/2023    Patient will have improved function and activity level as evidenced by an increase in FOTO score by 10 points or more.   Baseline: (Need FOTO score here) Goal status: ONGOING   2.  Pt will increase right shoulder ROM to be within 10% of uninvolved side in order to increase function and allow her to better participate in caring for her child and return to swimming  Baseline:  Shoulder flexion 130/140*Pain starting at 60 deg [facility]  Shoulder abduction 70/110 "  Shoulder internal rotation 70/90* "  Shoulder external rotation 60/70 "   Goal status: ONGOING  3.  Pt will increase right parascapular strength in MMT by 1/3 (i.e. 4 to 4+) in order to increase increase function and improve ability to participate in hobbies of swimming and gardening and function in ADL's Baseline:  Shoulder flexion 3- 5  Shoulder extension (seated) 2 4+  Shoulder abduction 3- 4+  Shoulder internal rotation 2 4  Shoulder external  rotation 2 4   Goal status: ONGOING  4.  Pt will be able to lift and carry 20 lbs using BUE without increase in pain >3/10 on NPRSin order to increase increase function and improve ability to participate in hobbies of swimming and gardening and function in ADL's Baseline: Unable to test Goal status: ONGOING  5.  Pt will be able to lift 10# overhead with RUE  without pain increasing >3/10 on NPRS in order to increase increase function and improve ability to participate in hobbies of swimming and gardening and function in ADL's  Baseline: 0 lbs Goal status: ONGOING   PLAN:  PT FREQUENCY: 1-2x/week  PT DURATION: 10 weeks  PLANNED INTERVENTIONS: 97164- PT Re-evaluation, 97110-Therapeutic exercises, 97530- Therapeutic activity, 97112- Neuromuscular re-education, 97535- Self Care, 96045- Manual therapy, and Joint mobilization  PLAN FOR NEXT SESSION: FOTO. Continue rotator cuff strengthening in pain free range; program more periscapular and shoulder flexion resistance exercises.    Arcelia Jew, Student-PT 03/19/2023, 1:22 PM

## 2023-03-26 ENCOUNTER — Telehealth: Payer: Self-pay | Admitting: Physical Therapy

## 2023-03-26 NOTE — Telephone Encounter (Signed)
Called to inquire about adding patient to schedule given she had a week absence due to scheduling. Pt scheduled for opening tomorrow.

## 2023-03-27 ENCOUNTER — Ambulatory Visit: Payer: BC Managed Care – PPO | Attending: Orthopedic Surgery | Admitting: Physical Therapy

## 2023-03-27 DIAGNOSIS — G8929 Other chronic pain: Secondary | ICD-10-CM | POA: Diagnosis not present

## 2023-03-27 DIAGNOSIS — M25511 Pain in right shoulder: Secondary | ICD-10-CM | POA: Insufficient documentation

## 2023-03-27 NOTE — Therapy (Signed)
OUTPATIENT PHYSICAL THERAPY SHOULDER TREATMENT   Patient Name: Connie Webster MRN: [id number] DOB:1980/08/19, 42 y.o., female Today's Date: 03/27/2023  END OF SESSION:  PT End of Session - 03/27/23 1458     Visit Number 3    Number of Visits 20    Date for PT Re-Evaluation 05/23/23    Authorization Type BCBS    Authorization Time Period 11/22/2022-11/21/2023    Authorization - Visit Number 3    Authorization - Number of Visits 20    Progress Note Due on Visit 10    PT Start Time 1345    PT Stop Time 1425    PT Time Calculation (min) 40 min    Activity Tolerance Patient tolerated treatment well    Behavior During Therapy [facility] for tasks assessed/performed               Past Medical History:  Diagnosis Date   Anxiety    Asthma    frequently uses inhaler   Depression    takes meds   Elevated blood pressure affecting pregnancy, antepartum 03/06/2017   Gestational diabetes    diet controlled   Gestational diabetes    Hypertension    gestational    Umbilical hernia    Past Surgical History:  Procedure Laterality Date   DILATION AND CURETTAGE OF UTERUS     TUBAL LIGATION Bilateral 02/21/2020   Procedure: POST PARTUM TUBAL LIGATION;  Surgeon: Hildred Laser, MD;  Location: [facility] ORS;  Service: Gynecology;  Laterality: Bilateral;   Patient Active Problem List   Diagnosis Date Noted   Vitamin D deficiency 09/27/2019   History of UTI 09/25/2019   Umbilical hernia 02/05/2017    PCP: Enid Baas, MD   REFERRING PROVIDER: Kennedy Bucker, MD   REFERRING DIAG: Diagnosis M25.811 (ICD-10-CM) - Other specified joint disorders, right shoulder M75.111 (ICD-10-CM) - Incomplete rotator cuff tear or rupture of right shoulder, not specified as traumatic    THERAPY DIAG:  Chronic right shoulder pain  Rationale for Evaluation and Treatment: Rehabilitation  ONSET DATE: More than 4 years ago  SUBJECTIVE:                                                                                                                                                                                       SUBJECTIVE STATEMENT: Pt reports aggravating shoulder after trying to vacuum. 10/10 yesterday with throbbing/sharp pains. Took ibuprofen. 7-8/10 today. Pt reports decreased pain and performing well on HEP prior to this.   PERTINENT HISTORY: Right shoulder pain, "was having trouble with scapula". Lifelong swimming, but stopped 4 years. Did butterfly and backstroke. Pain has been at this level for about  2 years. Has been limiting caring for 42 year old, house work, swimming.   PAIN:  Are you having pain? Yes: NPRS scale: 6/10 Pain location: Front and back of shoulder and around scapula Pain description: Can feel like pinching, sharp, and/or sore Aggravating factors: Moving right arm, cleaning around the house Relieving factors: Ice  PRECAUTIONS: None  RED FLAGS: None   WEIGHT BEARING RESTRICTIONS: No  FALLS:  Has patient fallen in last 6 months? No  LIVING ENVIRONMENT: Lives with: lives with their family Lives in: House/apartment Stairs: Yes: External: 3 steps; on left going up Has following equipment at home: None  OCCUPATION: Not working  PLOF: Independent  PATIENT GOALS: Better range, more use of right UE, decrease pain, swimming  NEXT MD VISIT: Not sure   OBJECTIVE:  Note: Objective measures were completed at Evaluation unless otherwise noted.  DIAGNOSTIC FINDINGS:  "AP, scapular Y, and axillary view x-rays of the right shoulder were ordered and personally reviewed today. These show some superior subluxation of the clavicle at the [facility] joint, a tiny osteophyte at the inferior humeral head, no joint space narrowing. No evidence of any decreased subacromial space on the scapular Y, and some sclerosis at the tuberosity, otherwise essentially normal right shoulder.   Document Attestation: I, Dawn Royse, have reviewed and updated documentation for  [facility], MD, utilizing Nuance DAX. "  PATIENT SURVEYS:  FOTO 32 with goal of 60  COGNITION: Overall cognitive status: Within functional limits for tasks assessed     SENSATION: WFL   UPPER EXTREMITY ROM:   Active ROM Right eval Left eval  Shoulder flexion 130/140*Pain starting at 60 deg [facility]  Shoulder extension    Shoulder abduction 70/110 "  Shoulder adduction    Shoulder internal rotation 70/90* "  Shoulder external rotation 60/70 "  Elbow flexion    Elbow extension    Wrist flexion    Wrist extension    Wrist ulnar deviation    Wrist radial deviation    Wrist pronation    Wrist supination    (Blank rows = not tested)  Cervical Ext - WFL but feels tight/strange R lat flex - 25 discordant pain   UPPER EXTREMITY MMT:  MMT Right eval Left eval  Shoulder flexion 3- 5  Shoulder extension (seated) 2 4+  Shoulder abduction 3- 4+  Shoulder adduction    Shoulder internal rotation 2 4  Shoulder external rotation 2 4  Middle trapezius    Lower trapezius    Elbow flexion    Elbow extension    Wrist flexion    Wrist extension    Wrist ulnar deviation    Wrist radial deviation    Wrist pronation    Wrist supination    Grip strength (lbs) 12# (@ grip distance 1) 03/19/23 58# (@1 ) (78 # @ 2) 03/19/23  (Blank rows = not tested)  SHOULDER SPECIAL TESTS:  SLAP lesions: Biceps load test: positive   Rotator cuff assessment: External rotation lag sign: negative, Belly press test: negative, and Lateral job: positive  Biceps assessment: Speed's test: positive   JOINT MOBILITY TESTING:  Deferred  PALPATION:  Trigger points around parascapular musculature   TODAY'S TREATMENT:  DATE:  03/27/23           All single arm exercises done on RUE Warm-up Pendulums 3x30s Closed chain PROM flexion - 3x1:00 Closed chain PROM  abduction - 1x1:00 Closed chain PROM ER/IR - 1x1:00  - feels no pain Pt educated on nature of referred pain and avoiding shoulder intensive ADLs Resisted ER with yellow resistance band 2x15  - limited motion; pain free range  - pain starts to increase after 15 reps Resisted Abduction with yellow band (wrist and elbow bent) 1x3  - not able to keep elbow from moving backward without pain Belly press test: positive   Strength Supine resisted ER with red theraband 3x15 with 3 sec holds Abduction to 45 deg (wrist + elbow flexed) and red theraband 2x15 Resisted IR with red theraband 2x15  Range Closed chain flexion PROM Closed chain abduction PROM ER/IR PROM  03/19/23  All single arm exercises done on RUE Pendulums 3x30s Supine resisted ER with yellow theraband 3x15 with 3 sec holds Abduction to 20 deg 1x15  - pain at front of shoulder Abduction to 45 deg with wrist flexion 2x25  - no pain, feels muscles working Resisted IR with yellow theraband 2x25  - no pain if wrist remains flexed   03/14/23 Right ER isometric 1x10 Right IR isometric 1x10   PATIENT EDUCATION: Education details: Pt educated on course of PT, test findings, HEP (how to perform, how to access) Person educated: Patient Education method: Explanation Education comprehension: verbalized understanding  HOME EXERCISE PROGRAM: Access Code: [email or web address] URL: [email or web address] Date: 03/27/2023 Prepared by: Ellin Goodie  Exercises - Shoulder External Rotation with Anchored Resistance  - 1 x daily - 3 sets - 20 reps - Standing Single Arm Shoulder Abduction with Resistance  - 1 x daily - 3 sets - 20 reps - Standing Shoulder Internal Rotation with Anchored Resistance  - 1 x daily - 3 sets - 20 reps - Seated Shoulder Flexion Towel Slide at Table Top  - 1 x daily - 3 sets - 10 reps - Seated Shoulder Abduction Towel Slide at Table Top  - 1 x daily - 3 sets - 10 reps - Seated Shoulder ER and IR AAROM  wiht cane   - 1 x daily - 3 sets - 10 reps  ASSESSMENT:  CLINICAL IMPRESSION: Patient reports to the clinic with high pain after flaring shoulder pain up yesterday. She was limited by pain PT focused on light passive range of motion exercises and some strengthening in a pain free range to help reduce pain in right shoulder. Pt showed increased pain with right shoulder IR compared to previous sessions. Belly press test was negative on eval but positive today indicating subscapularis is likely now pain generator (SP 97) after re-aggravating her shoulder. This IR pain is less than 24 hours old and may resolve by next session, I will reassess at that point. PT would be beneficial to address these deficits to help her improve her ability to perform ADLs, swim, and care for her child with less pain.   OBJECTIVE IMPAIRMENTS: decreased ROM, decreased strength, and pain.   ACTIVITY LIMITATIONS: carrying, lifting, sleeping, transfers, dressing, self feeding, reach over head, hygiene/grooming, and caring for others  PARTICIPATION LIMITATIONS: meal prep, cleaning, laundry, driving, shopping, occupation, and yard work  PERSONAL FACTORS: Time since onset of injury/illness/exacerbation are also affecting patient's functional outcome.   REHAB POTENTIAL: Fair pt is relatively young, but has been having pain for several years  CLINICAL DECISION MAKING:  Stable/uncomplicated  EVALUATION COMPLEXITY: Low   GOALS: Goals reviewed with patient? No  SHORT TERM GOALS: Target date: 03/28/2023    Patient will demonstrate independent understanding of HEP to be able effectively manage underlying MSK condition.   Baseline: Performing independently   Goal status: ACHIEVED    LONG TERM GOALS: Target date: 05/23/2023    Patient will have improved function and activity level as evidenced by an increase in FOTO score by 10 points or more.   Baseline: 32 Goal status: ONGOING   2.  Pt will increase right shoulder ROM  to be within 10% of uninvolved side in order to increase function and allow her to better participate in caring for her child and return to swimming  Baseline:  Shoulder flexion 130/140*Pain starting at 60 deg [facility]  Shoulder abduction 70/110 "  Shoulder internal rotation 70/90* "  Shoulder external rotation 60/70 "   Goal status: ONGOING  3.  Pt will increase right parascapular strength in MMT by 1/3 (i.e. 4 to 4+) in order to increase increase function and improve ability to participate in hobbies of swimming and gardening and function in ADL's Baseline:  Shoulder flexion 3- 5  Shoulder extension (seated) 2 4+  Shoulder abduction 3- 4+  Shoulder internal rotation 2 4  Shoulder external rotation 2 4   Goal status: ONGOING  4.  Pt will be able to lift and carry 20 lbs using BUE without increase in pain >3/10 on NPRSin order to increase increase function and improve ability to participate in hobbies of swimming and gardening and function in ADL's Baseline: Unable to test Goal status: ONGOING  5.  Pt will be able to lift 10# overhead with RUE without pain increasing >3/10 on NPRS in order to increase increase function and improve ability to participate in hobbies of swimming and gardening and function in ADL's  Baseline: 0 lbs Goal status: ONGOING   PLAN:  PT FREQUENCY: 1-2x/week  PT DURATION: 10 weeks  PLANNED INTERVENTIONS: 97164- PT Re-evaluation, 97110-Therapeutic exercises, 97530- Therapeutic activity, 97112- Neuromuscular re-education, 97535- Self Care, 95638- Manual therapy, and Joint mobilization  PLAN FOR NEXT SESSION: If high pain resolves, continue rotator cuff strengthening and add more periscapular strength exercises (rows, serratus punches, etc).   Arcelia Jew, Student-PT 03/27/2023, 3:00 PM

## 2023-04-01 DIAGNOSIS — S29012S Strain of muscle and tendon of back wall of thorax, sequela: Secondary | ICD-10-CM | POA: Diagnosis not present

## 2023-04-01 DIAGNOSIS — J4520 Mild intermittent asthma, uncomplicated: Secondary | ICD-10-CM | POA: Diagnosis not present

## 2023-04-01 DIAGNOSIS — M542 Cervicalgia: Secondary | ICD-10-CM | POA: Diagnosis not present

## 2023-04-01 DIAGNOSIS — I10 Essential (primary) hypertension: Secondary | ICD-10-CM | POA: Diagnosis not present

## 2023-04-02 ENCOUNTER — Ambulatory Visit: Payer: BC Managed Care – PPO | Admitting: Physical Therapy

## 2023-04-04 ENCOUNTER — Ambulatory Visit: Payer: BC Managed Care – PPO | Admitting: Physical Therapy

## 2023-04-04 DIAGNOSIS — G8929 Other chronic pain: Secondary | ICD-10-CM | POA: Diagnosis not present

## 2023-04-04 DIAGNOSIS — M25511 Pain in right shoulder: Secondary | ICD-10-CM | POA: Diagnosis not present

## 2023-04-04 NOTE — Therapy (Addendum)
OUTPATIENT PHYSICAL THERAPY SHOULDER TREATMENT   Patient Name: Connie Webster MRN: [id number] DOB:[birth year], 42 y.o., female Today's Date: 04/04/2023  END OF SESSION:  PT End of Session - 04/04/23 0938     Visit Number 4    Number of Visits 20    Date for PT Re-Evaluation 05/23/23    Authorization Type [employer]    Authorization Time Period 11/22/2022-11/21/2023    Authorization - Visit Number 4    Authorization - Number of Visits 20    Progress Note Due on Visit 10    PT Start Time 0833    PT Stop Time 0917    PT Time Calculation (min) 44 min    Activity Tolerance Patient tolerated treatment well    Behavior During Therapy [facility] for tasks assessed/performed                Past Medical History:  Diagnosis Date   Anxiety    Asthma    frequently uses inhaler   Depression    takes meds   Elevated blood pressure affecting pregnancy, antepartum 03/06/2017   Gestational diabetes    diet controlled   Gestational diabetes    Hypertension    gestational    Umbilical hernia    Past Surgical History:  Procedure Laterality Date   DILATION AND CURETTAGE OF UTERUS     TUBAL LIGATION Bilateral 02/21/2020   Procedure: POST PARTUM TUBAL LIGATION;  Surgeon: Hildred Laser, MD;  Location: [facility] ORS;  Service: Gynecology;  Laterality: Bilateral;   Patient Active Problem List   Diagnosis Date Noted   Vitamin D deficiency 09/27/2019   History of UTI 09/25/2019   Umbilical hernia 02/05/2017    PCP: Enid Baas, MD   REFERRING PROVIDER: Kennedy Bucker, MD   REFERRING DIAG: Diagnosis M25.811 (ICD-10-CM) - Other specified joint disorders, right shoulder M75.111 (ICD-10-CM) - Incomplete rotator cuff tear or rupture of right shoulder, not specified as traumatic    THERAPY DIAG:  Chronic right shoulder pain  Rationale for Evaluation and Treatment: Rehabilitation  ONSET DATE: More than 4 years ago  SUBJECTIVE:                                                                                                                                                                                       SUBJECTIVE STATEMENT: Pt reports shoulder feels "gone" after having to pick husband off the floor and get him in the car because he passed out. Pain currently 8-9/10 pain.   PERTINENT HISTORY: Right shoulder pain, "was having trouble with scapula". Lifelong swimming, but stopped 4 years. Did butterfly and backstroke. Pain has been at this level for about 2  years. Has been limiting caring for 42 year old, house work, swimming.   PAIN:  Are you having pain? Yes: NPRS scale: 6/10 Pain location: Front and back of shoulder and around scapula Pain description: Can feel like pinching, sharp, and/or sore Aggravating factors: Moving right arm, cleaning around the house Relieving factors: Ice  PRECAUTIONS: None  RED FLAGS: None   WEIGHT BEARING RESTRICTIONS: No  FALLS:  Has patient fallen in last 6 months? No  LIVING ENVIRONMENT: Lives with: lives with their family Lives in: House/apartment Stairs: Yes: External: 3 steps; on left going up Has following equipment at home: None  OCCUPATION: Not working  PLOF: Independent  PATIENT GOALS: Better range, more use of right UE, decrease pain, swimming  NEXT MD VISIT: Not sure   OBJECTIVE:  Note: Objective measures were completed at Evaluation unless otherwise noted.  DIAGNOSTIC FINDINGS:  "AP, scapular Y, and axillary view x-rays of the right shoulder were ordered and personally reviewed today. These show some superior subluxation of the clavicle at the [facility] joint, a tiny osteophyte at the inferior humeral head, no joint space narrowing. No evidence of any decreased subacromial space on the scapular Y, and some sclerosis at the tuberosity, otherwise essentially normal right shoulder.   Document Attestation: I, Dawn Royse, have reviewed and updated documentation for [facility], MD, utilizing [employer] DAX.  "  PATIENT SURVEYS:  FOTO 32 with goal of 60  COGNITION: Overall cognitive status: Within functional limits for tasks assessed     SENSATION: WFL   UPPER EXTREMITY ROM:   Active ROM Right eval Left eval  Shoulder flexion 130/140*Pain starting at 60 deg [facility]  Shoulder extension    Shoulder abduction 70/110 "  Shoulder adduction    Shoulder internal rotation 70/90* "  Shoulder external rotation 60/70 "  Elbow flexion    Elbow extension    Wrist flexion    Wrist extension    Wrist ulnar deviation    Wrist radial deviation    Wrist pronation    Wrist supination    (Blank rows = not tested)  Cervical Ext - WFL but feels tight/strange R lat flex - 25 discordant pain   UPPER EXTREMITY MMT:  MMT Right eval Left eval  Shoulder flexion 3- 5  Shoulder extension (seated) 2 4+  Shoulder abduction 3- 4+  Shoulder adduction    Shoulder internal rotation 2 4  Shoulder external rotation 2 4  Middle trapezius    Lower trapezius    Elbow flexion    Elbow extension    Wrist flexion    Wrist extension    Wrist ulnar deviation    Wrist radial deviation    Wrist pronation    Wrist supination    Grip strength (lbs) 12# (@ grip distance 1) 03/19/23 58# (@1 ) (78 # @ 2) 03/19/23  (Blank rows = not tested)  SHOULDER SPECIAL TESTS:  SLAP lesions: Biceps load test: positive   Rotator cuff assessment: External rotation lag sign: negative, Belly press test: negative, and Lateral job: positive  Biceps assessment: Speed's test: positive   JOINT MOBILITY TESTING:  Deferred  PALPATION:  Trigger points around parascapular musculature   TODAY'S TREATMENT:  DATE:  04/03/13           All single arm exercises done on RUE Warm-up Pendulums 3x30s Closed chain PROM flexion - 3x1:00 Closed chain PROM adduction/abduction - 1x1:00 ER/IR active  ROM with elbow at side - 1x1:00 Resisted ER with yellow resistance band 2x20  - elbow at side  - min cuing for pain free range IR AROM 2x20  - elbow at side Abduction AROM  with bent elbow 2x20 Pt educated on icing and using sling after PT to allow rotator cuff muscles to recover    03/27/23           All single arm exercises done on RUE Warm-up Pendulums 3x30s Closed chain PROM flexion - 3x1:00 Closed chain PROM abduction - 1x1:00 Closed chain PROM ER/IR - 1x1:00  - feels no pain Pt educated on nature of referred pain and avoiding shoulder intensive ADLs Resisted ER with yellow resistance band 2x15  - limited motion; pain free range  - pain starts to increase after 15 reps Resisted Abduction with yellow band (wrist and elbow bent) 1x3  - not able to keep elbow from moving backward without pain Belly press test: positive   Strength Supine resisted ER with red theraband 3x15 with 3 sec holds Abduction to 45 deg (wrist + elbow flexed) and red theraband 2x15 Resisted IR with red theraband 2x15  Range Closed chain flexion PROM Closed chain abduction PROM ER/IR PROM  03/19/23  All single arm exercises done on RUE Pendulums 3x30s Supine resisted ER with yellow theraband 3x15 with 3 sec holds Abduction to 20 deg 1x15  - pain at front of shoulder Abduction to 45 deg with wrist flexion 2x25  - no pain, feels muscles working Resisted IR with yellow theraband 2x25  - no pain if wrist remains flexed   03/14/23 Right ER isometric 1x10 Right IR isometric 1x10   PATIENT EDUCATION: Education details: Pt educated on course of PT, test findings, HEP (how to perform, how to access) Person educated: Patient Education method: Explanation Education comprehension: verbalized understanding  HOME EXERCISE PROGRAM: Access Code: [email or web address] URL: [email or web address] Date: 03/27/2023 Prepared by: Ellin Goodie  Exercises - Shoulder External Rotation with Anchored  Resistance  - 1 x daily - 3 sets - 20 reps - Standing Single Arm Shoulder Abduction with Resistance  - 1 x daily - 3 sets - 20 reps - Standing Shoulder Internal Rotation with Anchored Resistance  - 1 x daily - 3 sets - 20 reps - Seated Shoulder Flexion Towel Slide at Table Top  - 1 x daily - 3 sets - 10 reps - Seated Shoulder Abduction Towel Slide at Table Top  - 1 x daily - 3 sets - 10 reps - Seated Shoulder ER and IR AAROM wiht cane   - 1 x daily - 3 sets - 10 reps  ASSESSMENT:  CLINICAL IMPRESSION: Patient reports to the clinic with high pain in right shoulder after having to pick up husband for an emergency. Despite pt being limited by pain, she demonstrates improvement of shoulder ROM with ability to perform IR at the [facility] without as much pain as compared to previous visit as pt was able to actively internally rotate. She also exhibits improvement in strength with ability actively abduct in limited range and externally rotate with resistance showing progress towards goals. PT would be beneficial to address these deficits to help her improve her ability to perform ADLs, swim, and care for her child with less pain.  OBJECTIVE IMPAIRMENTS: decreased ROM, decreased strength, and pain.   ACTIVITY LIMITATIONS: carrying, lifting, sleeping, transfers, dressing, self feeding, reach over head, hygiene/grooming, and caring for others  PARTICIPATION LIMITATIONS: meal prep, cleaning, laundry, driving, shopping, occupation, and yard work  PERSONAL FACTORS: Time since onset of injury/illness/exacerbation are also affecting patient's functional outcome.   REHAB POTENTIAL: Fair pt is relatively young, but has been having pain for several years  CLINICAL DECISION MAKING: Stable/uncomplicated  EVALUATION COMPLEXITY: Low   GOALS: Goals reviewed with patient? No  SHORT TERM GOALS: Target date: 03/28/2023    Patient will demonstrate independent understanding of HEP to be able effectively manage  underlying MSK condition.   Baseline: Performing independently   Goal status: ACHIEVED    LONG TERM GOALS: Target date: 05/23/2023    Patient will have improved function and activity level as evidenced by an increase in FOTO score by 10 points or more.   Baseline: 32 Goal status: ONGOING   2.  Pt will increase right shoulder ROM to be within 10% of uninvolved side in order to increase function and allow her to better participate in caring for her child and return to swimming  Baseline:  Shoulder flexion 130/140*Pain starting at 60 deg [facility]  Shoulder abduction 70/110 "  Shoulder internal rotation 70/90* "  Shoulder external rotation 60/70 "   Goal status: ONGOING  3.  Pt will increase right parascapular strength in MMT by 1/3 (i.e. 4 to 4+) in order to increase increase function and improve ability to participate in hobbies of swimming and gardening and function in ADL's Baseline:  Shoulder flexion 3- 5  Shoulder extension (seated) 2 4+  Shoulder abduction 3- 4+  Shoulder internal rotation 2 4  Shoulder external rotation 2 4   Goal status: ONGOING  4.  Pt will be able to lift and carry 20 lbs using BUE without increase in pain >3/10 on NPRSin order to increase increase function and improve ability to participate in hobbies of swimming and gardening and function in ADL's Baseline: Unable to test Goal status: ONGOING  5.  Pt will be able to lift 10# overhead with RUE without pain increasing >3/10 on NPRS in order to increase increase function and improve ability to participate in hobbies of swimming and gardening and function in ADL's  Baseline: 0 lbs Goal status: ONGOING   PLAN:  PT FREQUENCY: 1-2x/week  PT DURATION: 10 weeks  PLANNED INTERVENTIONS: 97164- PT Re-evaluation, 97110-Therapeutic exercises, 97530- Therapeutic activity, 97112- Neuromuscular re-education, 97535- Self Care, 78469- Manual therapy, and Joint mobilization  PLAN FOR NEXT SESSION: If high pain  resolves, continue rotator cuff strengthening and add more periscapular strength exercises (rows, serratus punches, etc). If not, continue A/PROM in pain free range with potential dry needling or joint mobs   Arcelia Jew, Student-PT 04/04/2023, 9:40 AM

## 2023-04-10 ENCOUNTER — Encounter: Payer: BC Managed Care – PPO | Admitting: Physical Therapy

## 2023-04-11 ENCOUNTER — Ambulatory Visit: Payer: BC Managed Care – PPO | Admitting: Physical Therapy

## 2023-04-15 DIAGNOSIS — M75111 Incomplete rotator cuff tear or rupture of right shoulder, not specified as traumatic: Secondary | ICD-10-CM | POA: Diagnosis not present

## 2023-04-25 ENCOUNTER — Ambulatory Visit: Payer: BC Managed Care – PPO | Admitting: Physical Therapy

## 2023-04-25 ENCOUNTER — Telehealth: Payer: Self-pay | Admitting: Physical Therapy

## 2023-04-25 NOTE — Telephone Encounter (Signed)
 Called pt to inquire about absence. Pt reports that she called ahead of time to cancel appointment because she is awaiting a shoulder MRI to determine why she is having worsening pain and reduced mobility. PT educated pt about ROM exercises to maintain shoulder mobility while she awaits MRI and that her HEP would be modified to reflect these changes.

## 2023-04-29 ENCOUNTER — Ambulatory Visit: Payer: BC Managed Care – PPO | Admitting: Physical Therapy

## 2023-05-01 ENCOUNTER — Ambulatory Visit: Payer: BC Managed Care – PPO | Admitting: Physical Therapy

## 2023-05-06 ENCOUNTER — Encounter: Payer: BC Managed Care – PPO | Admitting: Physical Therapy

## 2023-05-08 ENCOUNTER — Encounter: Payer: BC Managed Care – PPO | Admitting: Physical Therapy

## 2023-05-13 ENCOUNTER — Encounter: Payer: BC Managed Care – PPO | Admitting: Physical Therapy

## 2023-05-15 ENCOUNTER — Encounter: Payer: BC Managed Care – PPO | Admitting: Physical Therapy

## 2023-05-20 ENCOUNTER — Encounter: Payer: BC Managed Care – PPO | Admitting: Physical Therapy

## 2023-05-22 ENCOUNTER — Encounter: Payer: BC Managed Care – PPO | Admitting: Physical Therapy

## 2023-05-28 ENCOUNTER — Other Ambulatory Visit: Payer: Self-pay | Admitting: Orthopedic Surgery

## 2023-05-28 DIAGNOSIS — M75111 Incomplete rotator cuff tear or rupture of right shoulder, not specified as traumatic: Secondary | ICD-10-CM

## 2023-06-05 ENCOUNTER — Encounter: Payer: Self-pay | Admitting: Family Medicine

## 2023-06-05 ENCOUNTER — Ambulatory Visit: Payer: BC Managed Care – PPO | Admitting: Family Medicine

## 2023-06-05 VITALS — BP 131/83 | HR 91 | Temp 97.7°F | Resp 20 | Ht 63.0 in | Wt 159.0 lb

## 2023-06-05 DIAGNOSIS — J4520 Mild intermittent asthma, uncomplicated: Secondary | ICD-10-CM

## 2023-06-05 DIAGNOSIS — Z91010 Allergy to peanuts: Secondary | ICD-10-CM | POA: Insufficient documentation

## 2023-06-05 DIAGNOSIS — F410 Panic disorder [episodic paroxysmal anxiety] without agoraphobia: Secondary | ICD-10-CM

## 2023-06-05 MED ORDER — BUSPIRONE HCL 5 MG PO TABS: 5.0000 mg | ORAL_TABLET | Freq: Two times a day (BID) | ORAL | 0 refills | Status: DC

## 2023-06-05 MED ORDER — EPINEPHRINE 0.3 MG/0.3ML IJ SOAJ: 0.3000 mg | INTRAMUSCULAR | 1 refills | Status: AC | PRN

## 2023-06-05 NOTE — Assessment & Plan Note (Signed)
Definitely some anxiety with driving.  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  Recommended she get therapy.  She has an EAP at work.  Gave her prescription for BuSpar 5 mg and suggested she take 1 twice a day.

## 2023-06-05 NOTE — Assessment & Plan Note (Signed)
Sometimes exercise-induced.  She uses an albuterol inhaler as needed.  Does not have controlling medications but does not feel that she needs them.

## 2023-06-05 NOTE — Progress Notes (Signed)
 New Patient Office Visit  Subjective    Patient ID: Connie Webster, female    DOB: [birth year]  Age: 43 y.o. MRN: [id number]  CC:  Chief Complaint  Patient presents with   Medical Management of Chronic Issues   Anxiety   Hypertension    HPI Connie Webster presents to establish care.  She is 43 years old and has a history of asthma, hypertension and more recently panic attacks.  She lives with her husband and 3 children ages 62 6 and 34.  She drives from [place] to [place] daily to work. Twice in the last year she has had panic while driving.  On 05/27/2023 she went to the ED with very high blood pressure, 190s over 100.  She felt like she was going to pass out.  She felt like she blacked out after she called for EMS.  She been out of her blood pressure medicine for a week prior to this..  She was definitely crying and felt panicky.  She went home from the ER and took her nifedipine XL 60 mg.   In May 24, 2022 she went to the ED for the same type of problem.Marland Kitchen  She was also having chest pain her EKG was normal. She was was prescribed Paxil but she did not take it because she does not know what the side effect profile might be. She thinks she may have started having panic attacks around July 2019 but thinks that the worst of her anxiety occurs because she has to drive for an hour and 20 minutes to get to work.  She does not do well in big groups. Past surgical history BTL. Past medical history: She has asthma mild intermittent and she swims every day to help with that.  Outpatient Encounter Medications as of 06/05/2023  Medication Sig   albuterol (PROVENTIL HFA;VENTOLIN HFA) 108 (90 BASE) MCG/ACT inhaler Inhale 2 puffs into the lungs every 4 (four) hours as needed for wheezing or shortness of breath.   EPINEPHrine 0.3 mg/0.3 mL IJ SOAJ injection Inject 0.3 mg into the muscle as needed for anaphylaxis.   NIFEdipine (PROCARDIA XL/NIFEDICAL XL) 60 MG 24 hr tablet Take  1 tablet by mouth daily.   [DISCONTINUED] busPIRone (BUSPAR) 5 MG tablet Take 1 tablet (5 mg total) by mouth 2 (two) times daily.   busPIRone (BUSPAR) 5 MG tablet Take 1 tablet (5 mg total) by mouth 2 (two) times daily.   [DISCONTINUED] predniSONE (DELTASONE) 20 MG tablet Take 2 tablets by mouth daily for 5 days followed by 1 tablet by mouth daily for 5 days and stop (Patient not taking: Reported on 03/14/2023)   No facility-administered encounter medications on file as of 06/05/2023.    Past Medical History:  Diagnosis Date   Anxiety    Asthma    frequently uses inhaler   Depression    takes meds   Elevated blood pressure affecting pregnancy, antepartum 03/06/2017   Gestational diabetes    diet controlled   Gestational diabetes    Hypertension    gestational    Umbilical hernia     Past Surgical History:  Procedure Laterality Date   DILATION AND CURETTAGE OF UTERUS     TUBAL LIGATION Bilateral 02/21/2020   Procedure: POST PARTUM TUBAL LIGATION;  Surgeon: Hildred Laser, MD;  Location: [facility] ORS;  Service: Gynecology;  Laterality: Bilateral;    Family History  Problem Relation Age of Onset   Epilepsy Father    Heart attack Father  Epilepsy Brother     Social History   Socioeconomic History   Marital status: Married    Spouse name: Not on file   Number of children: 1   Years of education: Not on file   Highest education level: Not on file  Occupational History   Not on file  Tobacco Use   Smoking status: Never   Smokeless tobacco: Never  Vaping Use   Vaping status: Never Used  Substance and Sexual Activity   Alcohol use: No   Drug use: No   Sexual activity: Yes    Birth control/protection: None  Other Topics Concern   Not on file  Social History Narrative   Not on file   Social Drivers of Health   Financial Resource Strain: Low Risk  (04/01/2023)   Received from [facility]   Overall Financial Resource Strain (CARDIA)    Difficulty of  Paying Living Expenses: Not hard at all  Food Insecurity: No Food Insecurity (04/01/2023)   Received from [facility]   Hunger Vital Sign    Worried About Running Out of Food in the Last Year: Never true    Ran Out of Food in the Last Year: Never true  Transportation Needs: No Transportation Needs (04/01/2023)   Received from [facility] - Transportation    In the past 12 months, has lack of transportation kept you from medical appointments or from getting medications?: No    Lack of Transportation (Non-Medical): No  Physical Activity: Not on file  Stress: Not on file  Social Connections: Not on file  Intimate Partner Violence: Not on file    ROS      Objective   BP 131/83 (BP Location: Left Arm, Patient Position: Sitting, Cuff Size: Normal)   Pulse 91   Temp 97.7 F (36.5 C) (Oral)   Resp 20   Ht 5\' 3"  (1.6 m)   Wt 159 lb (72.1 kg)   SpO2 98%   BMI 28.17 kg/m    Physical Exam Vitals and nursing note reviewed.  Constitutional:      Appearance: Normal appearance.  HENT:     Head: Normocephalic and atraumatic.  Eyes:     Conjunctiva/sclera: Conjunctivae normal.  Cardiovascular:     Rate and Rhythm: Normal rate and regular rhythm.  Pulmonary:     Effort: Pulmonary effort is normal.     Breath sounds: Normal breath sounds.  Musculoskeletal:     Right lower leg: No edema.     Left lower leg: No edema.  Skin:    General: Skin is warm and dry.  Neurological:     Mental Status: She is alert and oriented to person, place, and time.  Psychiatric:        Mood and Affect: Mood normal.        Behavior: Behavior normal.        Thought Content: Thought content normal.        Judgment: Judgment normal.        The ASCVD Risk score (Arnett DK, et al., 2019) failed to calculate for the following reasons:   Cannot find a previous HDL lab   Cannot find a previous total cholesterol lab     Assessment & Plan:  Panic  attacks Assessment & Plan: Definitely some anxiety with driving.  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  Recommended she get therapy.  She has an EAP at work.  Gave her prescription for BuSpar 5 mg and suggested she take 1 twice a day.  Orders: -     busPIRone HCl; Take 1 tablet (5 mg total) by mouth 2 (two) times daily.  Dispense: 60 tablet; Refill: 0  Peanut allergy -     EPINEPHrine; Inject 0.3 mg into the muscle as needed for anaphylaxis.  Dispense: 1 each; Refill: 1  History of peanut allergy Assessment & Plan: Refilled her EpiPen   Asthma, well controlled, mild intermittent Assessment & Plan: Sometimes exercise-induced.  She uses an albuterol inhaler as needed.  Does not have controlling medications but does not feel that she needs them.     Return in about 4 weeks (around 07/03/2023).   Alease Medina, MD

## 2023-06-05 NOTE — Assessment & Plan Note (Signed)
Refilled her EpiPen

## 2023-07-03 ENCOUNTER — Encounter: Payer: Self-pay | Admitting: Family Medicine

## 2023-07-03 ENCOUNTER — Ambulatory Visit: Payer: BC Managed Care – PPO | Admitting: Family Medicine

## 2023-07-03 VITALS — BP 144/99 | HR 79 | Temp 97.3°F | Resp 18 | Ht 63.0 in | Wt 161.0 lb

## 2023-07-03 DIAGNOSIS — F410 Panic disorder [episodic paroxysmal anxiety] without agoraphobia: Secondary | ICD-10-CM | POA: Diagnosis not present

## 2023-07-03 DIAGNOSIS — I10 Essential (primary) hypertension: Secondary | ICD-10-CM | POA: Diagnosis not present

## 2023-07-03 DIAGNOSIS — J4520 Mild intermittent asthma, uncomplicated: Secondary | ICD-10-CM | POA: Diagnosis not present

## 2023-07-03 MED ORDER — BUSPIRONE HCL 5 MG PO TABS: 5.0000 mg | ORAL_TABLET | Freq: Two times a day (BID) | ORAL | 1 refills | Status: DC

## 2023-07-03 MED ORDER — ALBUTEROL SULFATE HFA 108 (90 BASE) MCG/ACT IN AERS: 2.0000 | INHALATION_SPRAY | RESPIRATORY_TRACT | 3 refills | Status: DC | PRN

## 2023-07-03 NOTE — Assessment & Plan Note (Signed)
 Please continue the nifedipine XL 60 mg daily

## 2023-07-03 NOTE — Assessment & Plan Note (Signed)
 Appears that the combination of mindful meditation and BuSpar 5 mg twice daily has helped her driving anxiety and panic attacks.  Please continue with therapy and stay on the BuSpar 5 mg twice daily.  Follow-up in 3 months sooner if you need something.

## 2023-07-03 NOTE — Progress Notes (Signed)
   Established Patient Office Visit  Subjective   Patient ID: Connie Webster, female    DOB: [birth year]  Age: 43 y.o. MRN: [id number]  Chief Complaint  Patient presents with   Panic Attack    HPI 43 yo woman with panic attacks, anxiety and driving anxiety.  She has engaged EAP through work and has had several mindful meditation sessions.  After completing 6 weeks of mindful meditation she will engage the therapist.  She reports that she is doing really well she is driving to [facility] for work without any difficulty now.  The traffic was at a standstill yesterday and she noted that she was singing songs instead of getting anxious.  She is taking BuSpar 5 mg twice daily without any difficulty.  It appears to have no side effects for her. She did not take her nifedipine XL 60 mg today and her blood pressure is 152/108.  Recheck blood pressure 144/99    ROS    Objective:     BP (!) 144/99 (BP Location: Left Arm, Patient Position: Sitting, Cuff Size: Normal)   Pulse 79   Temp (!) 97.3 F (36.3 C) (Oral)   Resp 18   Ht 5\' 3"  (1.6 m)   Wt 161 lb (73 kg)   SpO2 99%   BMI 28.52 kg/m    Physical Exam Vitals reviewed.  Constitutional:      Appearance: Normal appearance.  HENT:     Head: Normocephalic.  Eyes:     General:        Right eye: No discharge.        Left eye: No discharge.  Cardiovascular:     Rate and Rhythm: Normal rate.  Pulmonary:     Effort: Pulmonary effort is normal.  Neurological:     Mental Status: She is alert and oriented to person, place, and time.  Psychiatric:        Mood and Affect: Mood normal.        Behavior: Behavior normal.        Thought Content: Thought content normal.        Judgment: Judgment normal.          No results found for any visits on 07/03/23.    The ASCVD Risk score (Arnett DK, et al., 2019) failed to calculate for the following reasons:   Cannot find a previous HDL lab   Cannot find a previous total  cholesterol lab    Assessment & Plan:  Asthma, well controlled, mild intermittent -     Albuterol Sulfate HFA; Inhale 2 puffs into the lungs every 4 (four) hours as needed for wheezing or shortness of breath.  Dispense: 18 g; Refill: 3  Panic attacks Assessment & Plan: Appears that the combination of mindful meditation and BuSpar 5 mg twice daily has helped her driving anxiety and panic attacks.  Please continue with therapy and stay on the BuSpar 5 mg twice daily.  Follow-up in 3 months sooner if you need something.  Orders: -     busPIRone HCl; Take 1 tablet (5 mg total) by mouth 2 (two) times daily.  Dispense: 180 tablet; Refill: 1  Primary hypertension Assessment & Plan: Please continue the nifedipine XL 60 mg daily      No follow-ups on file.    Alease Medina, MD

## 2023-07-08 ENCOUNTER — Other Ambulatory Visit: Payer: BC Managed Care – PPO

## 2023-10-03 ENCOUNTER — Ambulatory Visit: Admitting: Family Medicine

## 2023-10-03 ENCOUNTER — Encounter: Payer: Self-pay | Admitting: Family Medicine

## 2023-10-03 VITALS — BP 123/80 | HR 111 | Temp 98.2°F | Resp 18 | Ht 63.0 in | Wt 154.0 lb

## 2023-10-03 DIAGNOSIS — R079 Chest pain, unspecified: Secondary | ICD-10-CM | POA: Diagnosis not present

## 2023-10-03 DIAGNOSIS — J4520 Mild intermittent asthma, uncomplicated: Secondary | ICD-10-CM

## 2023-10-03 MED ORDER — FLUTICASONE-SALMETEROL 100-50 MCG/ACT IN AEPB: 1.0000 | INHALATION_SPRAY | Freq: Two times a day (BID) | RESPIRATORY_TRACT | 3 refills | Status: AC

## 2023-10-03 MED ORDER — FAMOTIDINE 20 MG PO TABS: 20.0000 mg | ORAL_TABLET | Freq: Two times a day (BID) | ORAL | 3 refills | Status: AC

## 2023-10-03 MED ORDER — ALBUTEROL SULFATE HFA 108 (90 BASE) MCG/ACT IN AERS
2.0000 | INHALATION_SPRAY | RESPIRATORY_TRACT | 3 refills | Status: AC | PRN
Start: 2023-10-03 — End: ?

## 2023-10-03 NOTE — Progress Notes (Signed)
 Established Patient Office Visit  Subjective   Patient ID: Connie Webster, female    DOB: 09-Sep-1980  Age: 43 y.o. MRN: [id number]  Chief Complaint  Patient presents with   Medical Management of Chronic Issues    HPI Discussed the use of AI scribe software for clinical note transcription with the patient, who gave verbal consent to proceed.  History of Present Illness   Connie Webster is a 43 year old female with asthma who presents with increased asthma symptoms and medication side effects.  Over the past two weeks, she has experienced increased asthma symptoms without a clear trigger. She now uses her inhaler twice daily, primarily at night, compared to once a week previously. Nighttime symptoms are described as feeling like 'someone's sitting on my chest.' She has been on an inhaler since age 61 and has attended asthma camp in the past. There have been no recent changes in her activity level or exposure to known allergens.  She experiences jitteriness after taking her blood pressure medication in the morning, which she adjusted from evening dosing. She avoids coffee in the morning to mitigate this effect. Her blood pressure is well-controlled.  She manages anxiety and panic attacks with Buspar , taking one dose in the morning and another before driving home from work, as driving is a significant anxiety trigger. She feels the medication is effective and denies any side effects.  She reports experiencing sharp chest pains lasting less than a minute, occurring more frequently over the past two months, approximately once every two weeks. A past episode felt like acid reflux, lasting all night, accompanied by shortness of breath. She has not been using any medication for these symptoms and has not been eating much recently due to lack of appetite.  No fever, cough, or shortness of breath with exertion, such as climbing stairs. She does not have a cold or  nasal congestion.         ROS    Objective:     BP 123/80 (BP Location: Left Arm, Patient Position: Sitting, Cuff Size: Normal)   Pulse (!) 111   Temp 98.2 F (36.8 C) (Oral)   Resp 18   Ht 5' 3 (1.6 m)   Wt 154 lb (69.9 kg)   SpO2 98%   BMI 27.28 kg/m     Physical Exam HENT:     Head: Normocephalic.     Mouth/Throat:     Mouth: Mucous membranes are moist.   Cardiovascular:     Rate and Rhythm: Regular rhythm. Tachycardia present.  Pulmonary:     Effort: Pulmonary effort is normal.     Breath sounds: Normal breath sounds. No wheezing.   Musculoskeletal:        General: Normal range of motion.     Right lower leg: No edema.     Left lower leg: No edema.   Skin:    General: Skin is warm and dry.   Neurological:     General: No focal deficit present.     Mental Status: She is alert and oriented to person, place, and time.           No results found for any visits on 10/03/23.    The ASCVD Risk score (Arnett DK, et al., 2019) failed to calculate for the following reasons:   Cannot find a previous HDL lab   Cannot find a previous total cholesterol lab    Assessment & Plan:  Chest pain, unspecified type -  EKG 12-Lead  Asthma, well controlled, mild intermittent -     Albuterol  Sulfate HFA; Inhale 2 puffs into the lungs every 4 (four) hours as needed for wheezing or shortness of breath.  Dispense: 18 g; Refill: 3 -     Fluticasone-Salmeterol; Inhale 1 puff into the lungs 2 (two) times daily.  Dispense: 1 each; Refill: 3  Assessment and Plan    Asthma Poorly controlled with increased nocturnal symptoms. No current controller medication. Advair recommended for improved control. - Prescribe Advair 100/50 mcg, one inhalation twice daily. - Instruct her to rinse mouth thoroughly after Advair use.  Chest Pain Intermittent sharp pain, possibly GERD or musculoskeletal. Famotidine  recommended for reflux management. - Prescribe famotidine  as needed  for acid reflux. - Order EKG to evaluate cardiac function.  Hypertension Well-controlled at 123/80 mmHg. Jitteriness post-medication reduced by avoiding morning coffee.  Anxiety with Panic Attacks Anxiety related to driving managed with Buspar . No side effects reported.         Return in about 4 weeks (around 10/31/2023).    Ayven Pheasant K Solita Macadam, MD

## 2023-10-03 NOTE — Progress Notes (Signed)
 ekg

## 2023-11-01 ENCOUNTER — Ambulatory Visit: Admitting: Family Medicine

## 2023-11-04 ENCOUNTER — Ambulatory Visit: Admitting: Family Medicine

## 2023-11-04 ENCOUNTER — Encounter: Payer: Self-pay | Admitting: Family Medicine

## 2023-11-04 VITALS — BP 130/89 | HR 84 | Temp 98.3°F | Resp 18 | Ht 63.0 in | Wt 154.0 lb

## 2023-11-04 DIAGNOSIS — I10 Essential (primary) hypertension: Secondary | ICD-10-CM | POA: Diagnosis not present

## 2023-11-04 DIAGNOSIS — J4520 Mild intermittent asthma, uncomplicated: Secondary | ICD-10-CM | POA: Diagnosis not present

## 2023-11-04 DIAGNOSIS — F410 Panic disorder [episodic paroxysmal anxiety] without agoraphobia: Secondary | ICD-10-CM

## 2023-11-04 MED ORDER — BUSPIRONE HCL 5 MG PO TABS: 5.0000 mg | ORAL_TABLET | Freq: Two times a day (BID) | ORAL | 1 refills | Status: AC

## 2023-11-04 MED ORDER — NIFEDIPINE ER OSMOTIC RELEASE 60 MG PO TB24: 60.0000 mg | ORAL_TABLET | Freq: Every day | ORAL | 1 refills | Status: AC

## 2023-11-04 NOTE — Assessment & Plan Note (Signed)
 Good control of her asthma with Advair 100/50.  Please let me know if you require albuterol  more than once or twice a week or if you have more than 2 nocturnal symptoms in a month.

## 2023-11-04 NOTE — Assessment & Plan Note (Signed)
 She is in therapy for anxiety and panic.  She is on BuSpar  5 mg twice a day which helps her with driving.  She is resistant to taking additional medication at this time.  We have discussed Lexapro in particular and she will think about it.  At low dosage, Lexapro may cause increased sweating, vivid dreaming and sleepiness.

## 2023-11-04 NOTE — Progress Notes (Signed)
 Established Patient Office Visit  Subjective   Patient ID: Connie Webster, female    DOB: [birth year]  Age: 43 y.o. MRN: [id number]  Chief Complaint  Patient presents with   Medical Management of Chronic Issues    HPI Delightful 43 yo woman with anxiety/panic, asthma, HTN, GERD and vitamin D  deficiency.   Patient did not fill out PHQ-9 and GAD-7 today.  Because she feels like no action is taken despite what she records.  She is currently in therapy with a EAP and does find this helpful.  She is on BuSpar  5 mg 1 in the morning and 1 in the afternoon.  She does find this effective and helps her with her drive to and from work.  She was offered Paxil in the past but would not take it because of the side effect profile.  We have discussed Lexapro and she does not want to take this because of side effect profile.  She reports that her anxiety and panic has not gotten any worse.  It is more of the same.  She states she feels like she is on a hamster wheel.  She does not want to take other medications.  She does report that she has not had a panic attack recently. She is doing better with her asthma.  Reports that summer is the hardest time for her asthma.  She is swimming daily.  She is using her albuterol  1-2 times a week and she is only had to use it once at night since starting on Advair 100/50.  She is rinsing her mouth after usage.  She feels the Advair has been very effective at controlling her asthma. She reports no GERD symptoms.  She is on famotidine  taking out without difficulty.     ROS    Objective:     BP 130/89 (BP Location: Left Arm, Patient Position: Sitting, Cuff Size: Normal)   Pulse 84   Temp 98.3 F (36.8 C) (Oral)   Resp 18   Ht 5' 3 (1.6 m)   Wt 154 lb (69.9 kg)   SpO2 98%   BMI 27.28 kg/m    Physical Exam Vitals and nursing note reviewed.  Constitutional:      Appearance: Normal appearance.  HENT:     Head: Normocephalic and atraumatic.   Eyes:     Conjunctiva/sclera: Conjunctivae normal.  Cardiovascular:     Rate and Rhythm: Normal rate and regular rhythm.  Pulmonary:     Effort: Pulmonary effort is normal.     Breath sounds: Normal breath sounds.  Musculoskeletal:     Right lower leg: No edema.     Left lower leg: No edema.  Skin:    General: Skin is warm and dry.  Neurological:     Mental Status: She is alert and oriented to person, place, and time.  Psychiatric:        Mood and Affect: Mood normal.        Behavior: Behavior normal.        Thought Content: Thought content normal.        Judgment: Judgment normal.          No results found for any visits on 11/04/23.    The ASCVD Risk score (Arnett DK, et al., 2019) failed to calculate for the following reasons:   Cannot find a previous HDL lab   Cannot find a previous total cholesterol lab    Assessment & Plan:  Asthma, well controlled, mild intermittent Assessment &  Plan: Good control of her asthma with Advair 100/50.  Please let me know if you require albuterol  more than once or twice a week or if you have more than 2 nocturnal symptoms in a month.   Primary hypertension Assessment & Plan: Blood pressure is well-controlled on nifedipine  XL 60 mg daily.   Panic attacks Assessment & Plan: She is in therapy for anxiety and panic.  She is on BuSpar  5 mg twice a day which helps her with driving.  She is resistant to taking additional medication at this time.  We have discussed Lexapro in particular and she will think about it.  At low dosage, Lexapro may cause increased sweating, vivid dreaming and sleepiness.        Return in about 8 weeks (around 12/30/2023).    Safiyyah Vasconez K Zackarey Holleman, MD

## 2023-11-04 NOTE — Assessment & Plan Note (Signed)
 Blood pressure is well-controlled on nifedipine  XL 60 mg daily.

## 2023-11-15 ENCOUNTER — Other Ambulatory Visit (HOSPITAL_COMMUNITY)
Admission: RE | Admit: 2023-11-15 | Discharge: 2023-11-15 | Disposition: A | Discharge status: Home / Self Care | Source: Ambulatory Visit | Attending: Certified Nurse Midwife | Admitting: Certified Nurse Midwife

## 2023-11-15 ENCOUNTER — Ambulatory Visit

## 2023-11-15 VITALS — BP 149/103 | HR 83 | Ht 63.25 in | Wt 152.1 lb

## 2023-11-15 DIAGNOSIS — N898 Other specified noninflammatory disorders of vagina: Secondary | ICD-10-CM | POA: Diagnosis not present

## 2023-11-15 DIAGNOSIS — Z113 Encounter for screening for infections with a predominantly sexual mode of transmission: Secondary | ICD-10-CM | POA: Diagnosis not present

## 2023-11-15 DIAGNOSIS — B379 Candidiasis, unspecified: Secondary | ICD-10-CM

## 2023-11-15 NOTE — Progress Notes (Signed)
    NURSE VISIT NOTE  Subjective:    Patient ID: Connie Webster, female    DOB: April 25, 1980, 43 y.o.   MRN: [id number]  HPI  Patient is a 43 y.o. [phone] female who presents for clear, white, malodorous, and thick vaginal discharge for 1 month(s). Denies abnormal vaginal bleeding or significant pelvic pain or fever. Denies UTI symptoms. Patient denies history of known exposure to STD.   Objective:    BP (!) 149/103   Pulse 83   Ht 5' 3.25 (1.607 m)   Wt 152 lb 1.6 oz (69 kg)   LMP 09/23/2023 (Approximate)   BMI 26.73 kg/m      Assessment:   1. Vaginal discharge   2. Screen for STD (sexually transmitted disease)      Plan:   GC and chlamydia DNA  probe sent to lab. Treatment: Await results for further treatment. ROV prn if symptoms persist or worsen. Elevated BP in office today. Patient states she did not take her BP medication today but typically does as well as checks her BP at home daily. Following up with PCP in September.   Waddell JONELLE Maxim, CMA

## 2023-11-19 ENCOUNTER — Ambulatory Visit: Payer: Self-pay

## 2023-11-19 ENCOUNTER — Telehealth: Payer: Self-pay

## 2023-11-19 LAB — CERVICOVAGINAL ANCILLARY ONLY
Bacterial Vaginitis (gardnerella): NEGATIVE
Candida Glabrata: NEGATIVE
Candida Vaginitis: POSITIVE — AB
Chlamydia: NEGATIVE
Comment: NEGATIVE
Comment: NEGATIVE
Comment: NEGATIVE
Comment: NEGATIVE
Comment: NEGATIVE
Comment: NORMAL
Neisseria Gonorrhea: NEGATIVE
Trichomonas: NEGATIVE

## 2023-11-19 MED ORDER — FLUCONAZOLE 150 MG PO TABS: 150.0000 mg | ORAL_TABLET | Freq: Every day | ORAL | 0 refills | Status: AC

## 2023-11-19 NOTE — Addendum Note (Signed)
 Addended by: ALAINA WADDELL SAUNDERS on: 11/19/2023 04:32 PM   Modules accepted: Orders

## 2023-11-19 NOTE — Telephone Encounter (Signed)
 N/a

## 2024-01-07 ENCOUNTER — Ambulatory Visit: Admitting: Family Medicine

## 2024-04-27 ENCOUNTER — Emergency Department
Admission: EM | Admit: 2024-04-27 | Discharge: 2024-04-27 | Disposition: A | Discharge status: Home / Self Care | Attending: Emergency Medicine | Admitting: Emergency Medicine

## 2024-04-27 ENCOUNTER — Ambulatory Visit: Payer: Self-pay

## 2024-04-27 ENCOUNTER — Other Ambulatory Visit: Payer: Self-pay

## 2024-04-27 DIAGNOSIS — X58XXXA Exposure to other specified factors, initial encounter: Secondary | ICD-10-CM | POA: Insufficient documentation

## 2024-04-27 DIAGNOSIS — I10 Essential (primary) hypertension: Secondary | ICD-10-CM | POA: Insufficient documentation

## 2024-04-27 DIAGNOSIS — G43019 Migraine without aura, intractable, without status migrainosus: Secondary | ICD-10-CM | POA: Diagnosis not present

## 2024-04-27 DIAGNOSIS — S199XXA Unspecified injury of neck, initial encounter: Secondary | ICD-10-CM | POA: Diagnosis present

## 2024-04-27 DIAGNOSIS — S161XXA Strain of muscle, fascia and tendon at neck level, initial encounter: Secondary | ICD-10-CM | POA: Diagnosis not present

## 2024-04-27 DIAGNOSIS — Z79899 Other long term (current) drug therapy: Secondary | ICD-10-CM | POA: Insufficient documentation

## 2024-04-27 DIAGNOSIS — J4520 Mild intermittent asthma, uncomplicated: Secondary | ICD-10-CM | POA: Insufficient documentation

## 2024-04-27 LAB — URINALYSIS, ROUTINE W REFLEX MICROSCOPIC
Bacteria, UA: NONE SEEN
Bilirubin Urine: NEGATIVE
Glucose, UA: NEGATIVE mg/dL
Ketones, ur: NEGATIVE mg/dL
Leukocytes,Ua: NEGATIVE
Nitrite: NEGATIVE
Protein, ur: NEGATIVE mg/dL
Specific Gravity, Urine: 1.016 (ref 1.005–1.030)
pH: 6 (ref 5.0–8.0)

## 2024-04-27 LAB — CBC WITH DIFFERENTIAL/PLATELET
Abs Immature Granulocytes: 0.01 K/uL (ref 0.00–0.07)
Basophils Absolute: 0 K/uL (ref 0.0–0.1)
Basophils Relative: 1 %
Eosinophils Absolute: 0.1 K/uL (ref 0.0–0.5)
Eosinophils Relative: 1 %
HCT: 43.4 % (ref 36.0–46.0)
Hemoglobin: 14.6 g/dL (ref 12.0–15.0)
Immature Granulocytes: 0 %
Lymphocytes Relative: 42 %
Lymphs Abs: 2.8 K/uL (ref 0.7–4.0)
MCH: 30.4 pg (ref 26.0–34.0)
MCHC: 33.6 g/dL (ref 30.0–36.0)
MCV: 90.4 fL (ref 80.0–100.0)
Monocytes Absolute: 0.6 K/uL (ref 0.1–1.0)
Monocytes Relative: 8 %
Neutro Abs: 3.1 K/uL (ref 1.7–7.7)
Neutrophils Relative %: 48 %
Platelets: 332 K/uL (ref 150–400)
RBC: 4.8 MIL/uL (ref 3.87–5.11)
RDW: 12.5 % (ref 11.5–15.5)
WBC: 6.5 K/uL (ref 4.0–10.5)
nRBC: 0 % (ref 0.0–0.2)

## 2024-04-27 LAB — BASIC METABOLIC PANEL WITH GFR
Anion gap: 11 (ref 5–15)
BUN: 11 mg/dL (ref 6–20)
CO2: 26 mmol/L (ref 22–32)
Calcium: 9.7 mg/dL (ref 8.9–10.3)
Chloride: 101 mmol/L (ref 98–111)
Creatinine, Ser: 0.85 mg/dL (ref 0.44–1.00)
GFR, Estimated: >60 mL/min
Glucose, Bld: 134 mg/dL — ABNORMAL HIGH (ref 70–99)
Potassium: 4 mmol/L (ref 3.5–5.1)
Sodium: 139 mmol/L (ref 135–145)

## 2024-04-27 LAB — POC URINE PREG, ED: Preg Test, Ur: NEGATIVE

## 2024-04-27 MED ORDER — NAPROXEN 500 MG PO TBEC: 500.0000 mg | DELAYED_RELEASE_TABLET | Freq: Two times a day (BID) | ORAL | 0 refills | Status: AC

## 2024-04-27 MED ORDER — CYCLOBENZAPRINE HCL 10 MG PO TABS
10.0000 mg | ORAL_TABLET | Freq: Once | ORAL | Status: AC
  Administered 2024-04-27: 10 mg via ORAL
  Filled 2024-04-27: qty 1

## 2024-04-27 MED ORDER — CYCLOBENZAPRINE HCL 10 MG PO TABS: 10.0000 mg | ORAL_TABLET | Freq: Three times a day (TID) | ORAL | 0 refills | Status: AC | PRN

## 2024-04-27 MED ORDER — ONDANSETRON 4 MG PO TBDP: 4.0000 mg | ORAL_TABLET | Freq: Three times a day (TID) | ORAL | 0 refills | Status: AC | PRN

## 2024-04-27 MED ORDER — KETOROLAC TROMETHAMINE 30 MG/ML IJ SOLN
30.0000 mg | Freq: Once | INTRAMUSCULAR | Status: AC
  Administered 2024-04-27: 30 mg via INTRAMUSCULAR
  Filled 2024-04-27: qty 1

## 2024-04-27 NOTE — Discharge Instructions (Signed)
 You have been diagnosed with migraine, cervical strain.  Please take Flexeril  1 tablet by mouth every 8 hours.  Avoid driving while taking Flexeril .  You are planning to drive please take Flexeril  only at bedtime.  Please take naproxen  1 tablet by mouth every 12 hours after breakfast and dinner.  Please drink plenty of fluids.  Please take Zofran  1 tablet 20 minutes before main meals to prevent nauseas or vomit.  Please come back to ED or go to your PCP if you have new symptoms symptoms worsen.  It was a pleasure to help you today Lorane Sar, PA-C

## 2024-04-27 NOTE — Telephone Encounter (Signed)
 FYI Only or Action Required?: FYI only for provider: ED advised.  Patient was last seen in primary care on 11/04/2023 by Ziglar, Devere POUR, MD.  Called Nurse Triage reporting Dizziness, Headache, and Nausea.  Symptoms began 2 weeks ago.  Interventions attempted: OTC medications: Excedrin.  Symptoms are: unchanged.  Triage Disposition: Go to ED Now (or PCP Triage)  Patient/caregiver understands and will follow disposition?: Yes                                  2. ONSET: When did the headache start? (e.g., minutes, hours, days)      2 weeks ago 4. SEVERITY: How bad is the pain? and What does it keep you from doing?  (e.g., Scale 1-10; mild, moderate, or severe)     Rates pain a 6 at this time 9. OTHER SYMPTOMS: Do you have any other symptoms? (e.g., fever, stiff neck, eye pain, sore throat, cold symptoms)     Nausea, jitteriness, feeling like she is going to pass out at times, reports her heart felt like it was pounding out of her chest last night,  Denies vision changes, denies changes in speech, denies numbness, denies one-sided weakness    This RN advised ED. Patient verbalized understanding and agreed to have her husband take her.   Reason for Disposition  Patient sounds very sick or weak to the triager  Protocols used: [facility]  Copied from CRM [id number]. Topic: Clinical - Red Word Triage >> Apr 27, 2024 12:03 PM Avram MATSU wrote: Red Word that prompted transfer to Nurse Triage: last two weeks have been getting bad headaches, had dizziness(feel like she going to past out), also drinks coffee a lot and feels jittery sometimes, have been taking BP medication. Also have really bad nausea

## 2024-04-27 NOTE — ED Provider Notes (Signed)
 "  [facility] Provider Note    Event Date/Time   First MD Initiated Contact with Patient 04/27/24 1659     (approximate)   History   Headache    HPI  Connie Webster is a 44 y.o. female    with a past medical history of asthma, panic attacks, mid back pain, cervicalgia, hypertension, anxiety,who presents to the ED complaining of headache. According to the patient, symptoms started 2 weeks ago with occipital headache and frontal headache, dizziness, nauseas, phosphenes and sometimes palpitation.  Patient has been taking Excedrin, BuSpar , nifedipine .  Relief of the symptoms.  Patient denies nasal congestion, postnasal drip, cough, chest pain, abdominal pain, diarrhea or urinary symptoms.  Patient is here with her husband, she is not driving tonight.    Patient Active Problem List   Diagnosis Date Noted   Hypertension 07/03/2023   Panic attacks 06/05/2023   History of peanut allergy 06/05/2023   Vitamin D  deficiency 09/27/2019   Asthma, well controlled, mild intermittent 02/10/2018     Physical Exam   Triage Vital Signs: ED Triage Vitals  Encounter Vitals Group     BP 04/27/24 1527 (!) 141/81     Girls Systolic BP Percentile --      Girls Diastolic BP Percentile --      Boys Systolic BP Percentile --      Boys Diastolic BP Percentile --      Pulse Rate 04/27/24 1527 87     Resp 04/27/24 1527 16     Temp 04/27/24 1527 97.8 F (36.6 C)     Temp Source 04/27/24 1527 Oral     SpO2 04/27/24 1527 96 %     Weight 04/27/24 1528 152 lb 1.9 oz (69 kg)     Height --      Head Circumference --      Peak Flow --      Pain Score 04/27/24 1527 6     Pain Loc --      Pain Education --      Exclude from Growth Chart --     Most recent vital signs: Vitals:   04/27/24 1527  BP: (!) 141/81  Pulse: 87  Resp: 16  Temp: 97.8 F (36.6 C)  SpO2: 96%     Physical Exam Vitals and nursing note reviewed.  During triage patient had systolic  hypertension.  General:          Awake, no distress.  Head: Tenderness to palpation in occipital area.  Tenderness to palpation in frontal or maxillary sinuses. Eyes: PERRLA Throat: Mild peritonsillar erythema, no tonsillar enlargement. CV:                  Good peripheral perfusion. Regular rate and rhythm. Resp:               Normal effort. no tachypnea.Equal breath sounds bilaterally.  No wheezing crackles or rales. Abd:                 No distention.  Soft nontender Other: Cervical spine: No tenderness to palpation in the midline, or paraspinal muscles.  Full ROM.  No focal neurological deficits.  Strength 5/5.           ED Results / Procedures / Treatments   Labs (all labs ordered are listed, but only abnormal results are displayed) Labs Reviewed  BASIC METABOLIC PANEL WITH GFR - Abnormal; Notable for the following components:      Result Value  Glucose, Bld 134 (*)    All other components within normal limits  URINALYSIS, ROUTINE W REFLEX MICROSCOPIC - Abnormal; Notable for the following components:   Color, Urine YELLOW (*)    APPearance CLEAR (*)    Hgb urine dipstick SMALL (*)    All other components within normal limits  CBC WITH DIFFERENTIAL/PLATELET  SALICYLATE LEVEL  ACETAMINOPHEN  LEVEL  POC URINE PREG, ED    PROCEDURES:  Critical Care performed:   Procedures   MEDICATIONS ORDERED IN ED: Medications  cyclobenzaprine  (FLEXERIL ) tablet 10 mg (10 mg Oral Given 04/27/24 1800)  ketorolac  (TORADOL ) 30 MG/ML injection 30 mg (30 mg Intramuscular Given 04/27/24 1800)   Clinical Course as of 04/27/24 1845  Mon Apr 27, 2024  1734 POC Urine Pregnancy, ED Negative [AE]  1734 CBC with Differential White blood cells, hemoglobin, platelets within normal limits. [AE]  1734 Basic metabolic panel(!) Electrolytes, renal function, kidney function, anion gap within normal limits [AE]  1800 Urinalysis, Routine w reflex microscopic -Urine, Clean Catch(!) Hemoglobin is present,  negative for UTI [AE]  1835 Acetaminophen  level I did call lab and acetaminophen  levels and salicylate levels did not arrive to the lab.  I will discharge patient [AE]  1845 Reassessed the patient, patient is feeling better patient is ready for discharge.  Patient is agreeable with the plan. [AE]    Clinical Course User Index [AE] Janit Kast, PA-C    IMPRESSION / MDM / ASSESSMENT AND PLAN / ED COURSE  I reviewed the triage vital signs and the nursing notes.  Differential diagnosis includes, but is not limited to, migraine, dehydration, electrolyte derangement, anemia.  Patient's presentation is most consistent with acute complicated illness / injury requiring diagnostic workup.   Connie Webster is a 44 y.o., female presents today with history of 2 weeks of headache, nauseas, dizziness, phosphenes.  See HPI for further information.  On a physical exam patient was hypertensive during triage.  Eyes PERRLA, neck supple, full ROM.  Tenderness to palpation in occipital area.  Cervical spine no tenderness to palpation in the midline or paraspinal muscles.  No tenderness to palpation in frontal or maxillary sinuses.  Mild peritonsillar erythema.  Cardiopulmonary is clear no wheezing no crackles no rales.  Abdomen is within normal limits.  Neurological, no focal deficits.  Strength 5/5.  Pulses positive. Plan Flexeril  Toradol  IV Referral to neurology CBC is unremarkable, UA ruling out UTI.  BMP, electrolytes, renal function within normal limits. Patient's diagnosis is consistent with migraine, cervical strain. I independently reviewed and interpreted imaging and agree with radiologists findings. Labs are  reassuring. I did review the patient's allergies and medications.The patient is in stable and satisfactory condition for discharge home  Patient will be discharged home with prescriptions for Flexeril , naproxen , Zofran . Patient is to follow up with neurologist as needed or otherwise  directed. Patient is given ED precautions to return to the ED for any worsening or new symptoms.  Work note will be provided. Discussed plan of care with patient, answered all of patient's questions, patient agreeable to plan of care. Advised patient to take medications according to the instructions on the label. Discussed possible side effects of new medications. Patient verbalized understanding.  FINAL CLINICAL IMPRESSION(S) / ED DIAGNOSES   Final diagnoses:  Intractable migraine without aura and without status migrainosus  Strain of neck muscle, initial encounter     Rx / DC Orders   ED Discharge Orders          Ordered  cyclobenzaprine  (FLEXERIL ) 10 MG tablet  3 times daily PRN        04/27/24 1836    naproxen  (EC NAPROSYN ) 500 MG EC tablet  2 times daily with meals        04/27/24 1836    ondansetron  (ZOFRAN -ODT) 4 MG disintegrating tablet  Every 8 hours PRN        04/27/24 1842             Note:  This document was prepared using Dragon voice recognition software and may include unintentional dictation errors.   Janit Kast, PA-C 04/27/24 1843    Willo Dunnings, MD 04/27/24 2336  "

## 2024-04-27 NOTE — ED Triage Notes (Signed)
 Ongoing headache and shakes, dizziness x 2 weeks.  Has Excedrin, Buspar , nifedipine  with no relief.  States headache worse x 3 days, also nausea. Yesterday when standing up from sitting became dizzy.  AAOx3. Skin warm and dry.  C/O headache, nausea, and a little bit shaky and slight dizziness.  Ambulates with easy and steady gait. MAE equally and strong. NAD

## 2024-05-11 ENCOUNTER — Ambulatory Visit: Admitting: Family Medicine

## 2024-05-11 ENCOUNTER — Encounter: Payer: Self-pay | Admitting: Family Medicine

## 2024-05-11 VITALS — BP 149/109 | HR 91 | Ht 63.25 in | Wt 148.0 lb

## 2024-05-11 DIAGNOSIS — G43109 Migraine with aura, not intractable, without status migrainosus: Secondary | ICD-10-CM | POA: Diagnosis not present

## 2024-05-11 DIAGNOSIS — G44209 Tension-type headache, unspecified, not intractable: Secondary | ICD-10-CM | POA: Insufficient documentation

## 2024-05-11 MED ORDER — RIZATRIPTAN BENZOATE 10 MG PO TBDP: 10.0000 mg | ORAL_TABLET | ORAL | 3 refills | Status: AC | PRN

## 2024-05-11 NOTE — Assessment & Plan Note (Signed)
 Please continue the nortriptyline as instructed by Dr. Lane.  Has Flexeril  she can take when the back of her neck starts hurting.

## 2024-05-11 NOTE — Assessment & Plan Note (Signed)
 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  Gets sensitive to noise and light.  Gets nauseated and vomits.  Will try Maxalt  10 mg.  New onset migraines, Will check MRI.

## 2024-05-11 NOTE — Progress Notes (Signed)
 "  Established Patient Office Visit  Subjective   Patient ID: Connie Webster, female    DOB: [birth year]  Age: 44 y.o. MRN: [id number]  Chief Complaint  Patient presents with   Headache    Pt has been having complaints with headaches and dizziness. On 1/5, states she began to get really dizzy at work and thought she was going to pass out and at the same time she had a bad headache and due to symptoms she went to the ED to be evaluated.    HPI Discussed the use of AI scribe software for clinical note transcription with the patient, who gave verbal consent to proceed.  History of Present Illness   Quaneisha Hanisch is a 44 year old female who presents with headaches and neck pain.  She has been experiencing two types of headaches since January 5th: tension headaches and ocular migraines. The tension headaches originate in her neck and extend over the top of her head. The ocular migraines are characterized by visual disturbances such as 'spots' or 'orbs' in her vision, eye pain, and are followed by severe headache pain. These migraines are also associated with photophobia, phonophobia, and nausea to the point of vomiting.  She had been taking Excedrin Migraine almost daily but has since stopped. She discussed this with Dr. Lane and understands that she will get rebound migraines from daily pain medication.  She started taking nortriptyline (Pamelor) at night, which has helped reduce the frequency and severity of her headaches. She has not experienced a headache since starting the medication five days ago. She is currently on a nortriptyline 10 mg dose and plans to increase to 20 mg.  She received Flexeril  from the emergency room for neck pain, which she takes once a day and finds it helpful. She describes a 'crook' in her neck, which she attributes to sleeping in an awkward position.  She has not had a brain scan yet. She has not used specific migraine medications  like Maxalt  or Imitrex before, relying instead on Excedrin and rest.  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 She lives in [place], which involves a significant daily commute.  No family history of migraines. No pregnancy, and her 'tubes are tied'. No sore throat.      Marrian Terrell-Brewington Terrell is a 44 year old female who presents with headaches and neck pain.  She has been experiencing two types of headaches since January 5th: tension headaches and ocular migraines. The tension headaches originate in her neck and extend over the top of her head. The ocular migraines are characterized by visual disturbances such as 'spots' or 'orbs' in her vision, eye pain, and are followed by severe headache pain. These migraines are also associated with photophobia, phonophobia, and nausea to the point of vomiting.  She has been taking Excedrin Migraine almost daily but has since stopped. She started taking nortriptyline (Pamelor) at night, which has helped reduce the frequency and severity of her headaches. She has not experienced a headache since starting the medication five days ago. She is currently on a 10 mg dose and plans to increase to 20 mg.  She received Flexeril  from the emergency room for neck pain, which she takes once a day and finds it helpful. She describes a 'crook' in her neck, which she attributes to sleeping in an awkward  position.  She has not had a brain scan yet. She has not used specific migraine medications like Maxalt  or Imitrex before, relying instead on Excedrin and rest.  She works as an programmer, systems, which involves physical activity and exposure to noise, although noise has not been a significant issue recently due to her work schedule. She lives in [place], which involves a  significant daily commute.  No family history of migraines. No pregnancy, and her 'tubes are tied'. No sore throat.       Objective:     BP (!) 149/109   Pulse 91   Ht 5' 3.25 (1.607 m)   Wt 148 lb (67.1 kg)   LMP 05/10/2024 (Exact Date)   SpO2 98%   BMI 26.01 kg/m    Physical Exam Vitals and nursing note reviewed.  Constitutional:      Appearance: Normal appearance.  HENT:     Head: Normocephalic and atraumatic.  Eyes:     Conjunctiva/sclera: Conjunctivae normal.  Cardiovascular:     Rate and Rhythm: Normal rate and regular rhythm.  Pulmonary:     Effort: Pulmonary effort is normal.     Breath sounds: Normal breath sounds.  Musculoskeletal:     Right lower leg: No edema.     Left lower leg: No edema.  Skin:    General: Skin is warm and dry.  Neurological:     Mental Status: She is alert and oriented to person, place, and time.  Psychiatric:        Mood and Affect: Mood normal.        Behavior: Behavior normal.        Thought Content: Thought content normal.        Judgment: Judgment normal.          No results found for any visits on 05/11/24.    The ASCVD Risk score (Arnett DK, et al., 2019) failed to calculate for the following reasons:   Cannot find a previous HDL lab   Cannot find a previous total cholesterol lab   * - Cholesterol units were assumed    Assessment & Plan:  Ocular migraine Assessment & Plan: Gets an ocular migraine in her left eye.  Gets sensitive to noise and light.  Gets nauseated and vomits.  Will try Maxalt  10 mg.  New onset migraines, Will check MRI.  Orders: -     MR BRAIN W WO CONTRAST; Future -     Rizatriptan  Benzoate; Take 1 tablet (10 mg total) by mouth as needed for migraine. May repeat in 2 hours if needed  Dispense: 10 tablet; Refill: 3  Tension headache Assessment & Plan: Please continue the nortriptyline as instructed by Dr. Lane.  Has Flexeril  she can take when the back of her neck starts  hurting.      No follow-ups on file.    Shelena Castelluccio K Alleta Avery, MD  "
